# Patient Record
Sex: Female | Born: 1965 | Race: White | Hispanic: No | Marital: Married | Smoking: Former smoker
Health system: Southern US, Community
[De-identification: ages and names within clinical notes are randomized; demographics above are authoritative.]

## PROBLEM LIST (undated history)

## (undated) DIAGNOSIS — R87619 Unspecified abnormal cytological findings in specimens from cervix uteri: Secondary | ICD-10-CM

## (undated) DIAGNOSIS — F419 Anxiety disorder, unspecified: Secondary | ICD-10-CM

## (undated) DIAGNOSIS — I10 Essential (primary) hypertension: Secondary | ICD-10-CM

## (undated) DIAGNOSIS — N200 Calculus of kidney: Secondary | ICD-10-CM

## (undated) HISTORY — PX: US LT BREAST BX W LOC DEV 1ST LESION IMG BX SPEC US GUIDE: IMG5431

## (undated) HISTORY — PX: AUGMENTATION MAMMAPLASTY: SUR837

## (undated) HISTORY — PX: BREAST SURGERY: SHX581

## (undated) HISTORY — PX: ANKLE SURGERY: SHX546

## (undated) HISTORY — DX: Anxiety disorder, unspecified: F41.9

## (undated) HISTORY — DX: Calculus of kidney: N20.0

## (undated) HISTORY — PX: COLPOSCOPY: SHX161

## (undated) HISTORY — PX: COSMETIC SURGERY: SHX468

## (undated) HISTORY — DX: Unspecified abnormal cytological findings in specimens from cervix uteri: R87.619

---

## 2004-10-03 HISTORY — PX: TUBAL LIGATION: SHX77

## 2012-04-26 LAB — HM PAP SMEAR: HM Pap smear: NORMAL

## 2012-12-25 ENCOUNTER — Ambulatory Visit: Payer: Self-pay | Admitting: Adult Health

## 2012-12-25 ENCOUNTER — Encounter: Payer: Self-pay | Admitting: Adult Health

## 2012-12-25 ENCOUNTER — Ambulatory Visit (INDEPENDENT_AMBULATORY_CARE_PROVIDER_SITE_OTHER): Payer: 59 | Admitting: Adult Health

## 2012-12-25 VITALS — BP 124/87 | HR 86 | Temp 98.0°F | Resp 16 | Ht 66.0 in | Wt 156.0 lb

## 2012-12-25 DIAGNOSIS — Z7189 Other specified counseling: Secondary | ICD-10-CM

## 2012-12-25 DIAGNOSIS — Z1239 Encounter for other screening for malignant neoplasm of breast: Secondary | ICD-10-CM

## 2012-12-25 DIAGNOSIS — M545 Low back pain: Secondary | ICD-10-CM

## 2012-12-25 DIAGNOSIS — Z7689 Persons encountering health services in other specified circumstances: Secondary | ICD-10-CM

## 2012-12-25 MED ORDER — CYCLOBENZAPRINE HCL 5 MG PO TABS: 5.0000 mg | ORAL_TABLET | Freq: Three times a day (TID) | ORAL | Status: DC | PRN

## 2012-12-25 NOTE — Assessment & Plan Note (Signed)
Suspect sciatica. She is currently seeing a Chiropractor with good results. Take ibuprofen 400 mg - 600 mg q6h prn. She can also take tylenol. Heat/cold 3-4 times daily. Pillow between knees if side sleeper; pillow under knees if lying on back. Stretching exercises. Avoid any heavy lifting. Start flexeril tid prn for muscle spasms. RTC if symptoms worsen or fail to improve within 3-4 weeks.

## 2012-12-25 NOTE — Patient Instructions (Addendum)
  Thank you for choosing [place] at [facility] for your health care needs.  We will request your medical records from your previous PCP.  We will contact you for your mammogram appointment.  For your back:  Continue to apply heat and or cold for relief.  You can take ibuprofen 400 mg - 600 mg every 6 hours as needed. Make sure that you have food with this. You can also continue with the tylenol.  Continue to take the Pepcid to protect your stomach  I have ordered Flexeril for muscle spasms. This will make you sleepy.  Remember to activate your MyChart

## 2012-12-25 NOTE — Progress Notes (Signed)
  Subjective:    Patient ID: Catherine Yoder, female    DOB: 1966/04/15, 47 y.o.   MRN: [id number]  HPI  Patient is a pleasant 47 y/o female who presents to clinic to establish care. She recently moved to the area in December from [place]. She is feeling well overall. She has the following concern:  1) Back pain - she thinks it started when she moved. She is currently seeing a chiropractor and feels this is improving. She takes 600 mg of ibuprofen daily for approximately 1 month. She feels that heat helps. Her pain is in the low back and radiates down the right buttocks to her thigh. She is also doing stretching exercises. Because of her pain, she has not been able to exercise.   Health Maintenance:  Tdap - 2011  Flu Vaccine - 10/2012  PAP - (every 3 years if normal) - 05/2012 (Normal). Has had abnormal 2010. Will need PAP 05/2013  Mammogram - Needs  Eye Exam - Needs. She will schedule  Dental Exam - Every 6 month  Colonoscopy - Age 68.  Labs: Recently had physical 05/2012  Exercise - Not in the past 4 month 2/2 moving and back pain  Diet - Follows lean meats, fruits/vegetables. Healthy fats.    Review of Systems  Constitutional: Negative for fever, chills, appetite change and fatigue.  HENT: Negative.   Eyes: Negative.   Respiratory: Negative.   Cardiovascular: Positive for palpitations. Negative for chest pain and leg swelling.       Occassional palpitations  Gastrointestinal: Positive for constipation. Negative for nausea, vomiting, abdominal pain and diarrhea.       Hemorrhoids  Endocrine: Negative.   Genitourinary: Positive for pelvic pain and dyspareunia. Negative for dysuria, frequency, hematuria, flank pain, vaginal discharge, difficulty urinating, genital sores and vaginal pain.  Musculoskeletal: Positive for back pain.    BP 124/87  Pulse 86  Temp(Src) 98 F (36.7 C) (Oral)  Ht 5\' 6"  (1.676 m)  Wt 156 lb (70.761 kg)  BMI 25.19 kg/m2  SpO2 100%  LMP  12/01/2012    Objective:   Physical Exam  Constitutional: She is oriented to person, place, and time. She appears well-developed and well-nourished. No distress.  HENT:  Head: Normocephalic and atraumatic.  Mouth/Throat: Oropharynx is clear and moist.  Moderate cerumen buildup bil ears.  Eyes: Conjunctivae and EOM are normal. Pupils are equal, round, and reactive to light.  Neck: Normal range of motion. Neck supple. No tracheal deviation present. No thyromegaly present.  Cardiovascular: Normal rate, regular rhythm, normal heart sounds and intact distal pulses.  Exam reveals no gallop.   No murmur heard. Pulmonary/Chest: Effort normal and breath sounds normal. No respiratory distress. She has no wheezes. She has no rales.  Abdominal: Soft. Bowel sounds are normal. She exhibits no distension. There is no tenderness. There is no rebound.  Musculoskeletal: Normal range of motion. She exhibits no edema and no tenderness.  Lymphadenopathy:    She has no cervical adenopathy.  Neurological: She is alert and oriented to person, place, and time.  Skin: Skin is warm and dry.  Psychiatric: She has a normal mood and affect. Her behavior is normal. Judgment and thought content normal.       Assessment & Plan:

## 2012-12-25 NOTE — Assessment & Plan Note (Signed)
Recently moved to the area from [place]. Wants to establish care with PCP. Last physical exam 05/2012 including PAP and labs. Will request medical records from previous PCP.

## 2013-06-02 ENCOUNTER — Emergency Department: Payer: Self-pay | Admitting: Emergency Medicine

## 2013-06-02 LAB — COMPREHENSIVE METABOLIC PANEL
Albumin: 3.8 g/dL (ref 3.4–5.0)
Bilirubin,Total: 1.3 mg/dL — ABNORMAL HIGH (ref 0.2–1.0)
Calcium, Total: 9 mg/dL (ref 8.5–10.1)
Co2: 28 mmol/L (ref 21–32)
Creatinine: 0.74 mg/dL (ref 0.60–1.30)
EGFR (African American): >60
Glucose: 72 mg/dL (ref 65–99)
Osmolality: 277 (ref 275–301)
SGOT(AST): 23 U/L (ref 15–37)
SGPT (ALT): 29 U/L (ref 12–78)
Sodium: 140 mmol/L (ref 136–145)

## 2013-06-02 LAB — URINALYSIS, COMPLETE
Bilirubin,UR: NEGATIVE
Blood: NEGATIVE
Ketone: NEGATIVE
Nitrite: NEGATIVE
Ph: 7 (ref 4.5–8.0)
Squamous Epithelial: 2
WBC UR: 1 /HPF (ref 0–5)

## 2013-06-02 LAB — CBC
HCT: 44.6 % (ref 35.0–47.0)
MCH: 31.3 pg (ref 26.0–34.0)
MCHC: 34.3 g/dL (ref 32.0–36.0)
MCV: 91 fL (ref 80–100)
Platelet: 166 10*3/uL (ref 150–440)
WBC: 6.6 10*3/uL (ref 3.6–11.0)

## 2013-06-06 ENCOUNTER — Ambulatory Visit (INDEPENDENT_AMBULATORY_CARE_PROVIDER_SITE_OTHER): Payer: 59 | Admitting: Adult Health

## 2013-06-06 ENCOUNTER — Encounter: Payer: Self-pay | Admitting: Adult Health

## 2013-06-06 VITALS — BP 134/88 | HR 68 | Temp 98.2°F | Resp 14 | Wt 153.0 lb

## 2013-06-06 DIAGNOSIS — R030 Elevated blood-pressure reading, without diagnosis of hypertension: Secondary | ICD-10-CM

## 2013-06-06 DIAGNOSIS — R232 Flushing: Secondary | ICD-10-CM | POA: Insufficient documentation

## 2013-06-06 DIAGNOSIS — G43909 Migraine, unspecified, not intractable, without status migrainosus: Secondary | ICD-10-CM

## 2013-06-06 DIAGNOSIS — R6889 Other general symptoms and signs: Secondary | ICD-10-CM

## 2013-06-06 DIAGNOSIS — Z712 Person consulting for explanation of examination or test findings: Secondary | ICD-10-CM

## 2013-06-06 DIAGNOSIS — Z7189 Other specified counseling: Secondary | ICD-10-CM

## 2013-06-06 DIAGNOSIS — R899 Unspecified abnormal finding in specimens from other organs, systems and tissues: Secondary | ICD-10-CM

## 2013-06-06 DIAGNOSIS — N951 Menopausal and female climacteric states: Secondary | ICD-10-CM

## 2013-06-06 LAB — HEPATIC FUNCTION PANEL
ALT: 19 U/L (ref 0–35)
Total Bilirubin: 1.2 mg/dL (ref 0.3–1.2)
Total Protein: 6.3 g/dL (ref 6.0–8.3)

## 2013-06-06 MED ORDER — RIZATRIPTAN BENZOATE 5 MG PO TBDP: 5.0000 mg | ORAL_TABLET | ORAL | Status: DC | PRN

## 2013-06-06 NOTE — Assessment & Plan Note (Signed)
Experiencing hot flashes around menstrual cycle. She would like to try something "natural". Recommend Estroven or product containing black cohosh. Pt agreed.

## 2013-06-06 NOTE — Assessment & Plan Note (Signed)
Has taken Maxalt with good results. No need from preventative tx at this time as these are not occurring frequently. Sent in prescription for Maxalt.

## 2013-06-06 NOTE — Progress Notes (Signed)
  Subjective:    Patient ID: Catherine Yoder, female    DOB: August 11, 1966, 47 y.o.   MRN: [id number]  HPI  Patient is a pleasant 47 y/o female who presents to clinic s/p ED visit for headache. She had labs and a head CT but did not stay for treatment. She presents to clinic to review her CT. She also reports that she went home and took one of her daughter's maxalt. She had excellent results from the medication and would like a prescription for this.   Current Outpatient Prescriptions on File Prior to Visit  Medication Sig Dispense Refill  . acetaminophen (TYLENOL) 500 MG tablet Take 500 mg by mouth 2 (two) times daily.      Marland Kitchen ibuprofen (ADVIL,MOTRIN) 600 MG tablet Take 600 mg by mouth daily.      . cyclobenzaprine (FLEXERIL) 5 MG tablet Take 1 tablet (5 mg total) by mouth 3 (three) times daily as needed for muscle spasms.  30 tablet  1   No current facility-administered medications on file prior to visit.    Review of Systems  Respiratory: Negative.   Cardiovascular: Negative.   Neurological: Negative.      BP 134/88  Pulse 68  Temp(Src) 98.2 F (36.8 C) (Oral)  Resp 14  Wt 153 lb (69.4 kg)  BMI 24.71 kg/m2  SpO2 98%  LMP 05/19/2013    Objective:   Physical Exam  Constitutional: She is oriented to person, place, and time. She appears well-developed and well-nourished.  Cardiovascular: Normal rate, regular rhythm and normal heart sounds.  Exam reveals no gallop and no friction rub.   No murmur heard. Pulmonary/Chest: Effort normal and breath sounds normal. No respiratory distress. She has no wheezes. She has no rales.  Neurological: She is alert and oriented to person, place, and time.  Psychiatric: She has a normal mood and affect. Her behavior is normal. Judgment and thought content normal.          Assessment & Plan:

## 2013-06-06 NOTE — Patient Instructions (Addendum)
  For your hot flashes you can try Estroven or anything with black cohosh   I have prescribed Maxalt for your migraine headache. You may repeat a dose after 2 hours if necessary. Do not take more than 2 doses in a 24 hour period.  Please have your blood work done prior to leaving the office.

## 2013-06-06 NOTE — Assessment & Plan Note (Signed)
May be transient or related to white coat syndrome. Patient will monitor b/p at work. She does not want to take daily medication. Suggested diet and exercise (life style change) to improve blood pressure readings. If readings remain elevated then will recommend starting with diuretic.

## 2013-06-06 NOTE — Assessment & Plan Note (Signed)
Status post CT in the ED for headache and visual disturbance. Normal head CT. Discussed with patient. Also, patient had elevated total bilirubin. Recheck hepatic panel.

## 2013-06-07 ENCOUNTER — Encounter: Payer: Self-pay | Admitting: *Deleted

## 2014-03-19 ENCOUNTER — Encounter: Payer: Self-pay | Admitting: *Deleted

## 2014-08-06 ENCOUNTER — Ambulatory Visit: Payer: Self-pay | Admitting: Internal Medicine

## 2015-11-13 ENCOUNTER — Other Ambulatory Visit: Payer: Self-pay | Admitting: Student

## 2015-11-13 DIAGNOSIS — R1012 Left upper quadrant pain: Secondary | ICD-10-CM

## 2015-11-19 ENCOUNTER — Ambulatory Visit
Admission: RE | Admit: 2015-11-19 | Discharge: 2015-11-19 | Disposition: A | Discharge status: Home / Self Care | Source: Ambulatory Visit | Attending: Student | Admitting: Student

## 2015-11-19 DIAGNOSIS — N281 Cyst of kidney, acquired: Secondary | ICD-10-CM | POA: Diagnosis not present

## 2015-11-19 DIAGNOSIS — R1012 Left upper quadrant pain: Secondary | ICD-10-CM | POA: Insufficient documentation

## 2015-11-19 MED ORDER — IOHEXOL 350 MG/ML SOLN
100.0000 mL | Freq: Once | INTRAVENOUS | Status: AC | PRN
  Administered 2015-11-19: 100 mL via INTRAVENOUS

## 2015-12-25 ENCOUNTER — Other Ambulatory Visit: Payer: Self-pay | Admitting: Internal Medicine

## 2015-12-25 DIAGNOSIS — N858 Other specified noninflammatory disorders of uterus: Secondary | ICD-10-CM

## 2015-12-25 DIAGNOSIS — N941 Unspecified dyspareunia: Secondary | ICD-10-CM

## 2015-12-31 ENCOUNTER — Ambulatory Visit
Admission: RE | Admit: 2015-12-31 | Discharge: 2015-12-31 | Disposition: A | Discharge status: Home / Self Care | Source: Ambulatory Visit | Attending: Internal Medicine | Admitting: Internal Medicine

## 2015-12-31 DIAGNOSIS — N941 Unspecified dyspareunia: Secondary | ICD-10-CM | POA: Insufficient documentation

## 2015-12-31 DIAGNOSIS — N859 Noninflammatory disorder of uterus, unspecified: Secondary | ICD-10-CM | POA: Diagnosis present

## 2015-12-31 DIAGNOSIS — N858 Other specified noninflammatory disorders of uterus: Secondary | ICD-10-CM

## 2017-10-19 ENCOUNTER — Other Ambulatory Visit: Payer: Self-pay | Admitting: Internal Medicine

## 2017-10-19 DIAGNOSIS — Z1239 Encounter for other screening for malignant neoplasm of breast: Secondary | ICD-10-CM

## 2017-11-10 ENCOUNTER — Ambulatory Visit
Admission: RE | Admit: 2017-11-10 | Discharge: 2017-11-10 | Disposition: A | Discharge status: Home / Self Care | Source: Ambulatory Visit | Attending: Internal Medicine | Admitting: Internal Medicine

## 2017-11-10 DIAGNOSIS — Z1231 Encounter for screening mammogram for malignant neoplasm of breast: Secondary | ICD-10-CM | POA: Insufficient documentation

## 2017-11-10 DIAGNOSIS — Z1239 Encounter for other screening for malignant neoplasm of breast: Secondary | ICD-10-CM

## 2017-11-14 ENCOUNTER — Other Ambulatory Visit: Payer: Self-pay | Admitting: Internal Medicine

## 2017-11-14 DIAGNOSIS — R928 Other abnormal and inconclusive findings on diagnostic imaging of breast: Secondary | ICD-10-CM

## 2017-11-14 DIAGNOSIS — N6310 Unspecified lump in the right breast, unspecified quadrant: Secondary | ICD-10-CM

## 2017-11-23 ENCOUNTER — Ambulatory Visit
Admission: RE | Admit: 2017-11-23 | Discharge: 2017-11-23 | Disposition: A | Discharge status: Home / Self Care | Source: Ambulatory Visit | Attending: Internal Medicine | Admitting: Internal Medicine

## 2017-11-23 DIAGNOSIS — R928 Other abnormal and inconclusive findings on diagnostic imaging of breast: Secondary | ICD-10-CM | POA: Insufficient documentation

## 2017-11-23 DIAGNOSIS — N6310 Unspecified lump in the right breast, unspecified quadrant: Secondary | ICD-10-CM

## 2018-06-13 ENCOUNTER — Ambulatory Visit
Admission: RE | Admit: 2018-06-13 | Discharge: 2018-06-13 | Disposition: A | Discharge status: Home / Self Care | Source: Ambulatory Visit | Attending: Internal Medicine | Admitting: Internal Medicine

## 2018-06-13 ENCOUNTER — Other Ambulatory Visit: Payer: Self-pay | Admitting: Internal Medicine

## 2018-06-13 DIAGNOSIS — R1031 Right lower quadrant pain: Secondary | ICD-10-CM

## 2018-06-13 DIAGNOSIS — N281 Cyst of kidney, acquired: Secondary | ICD-10-CM | POA: Diagnosis not present

## 2018-06-13 HISTORY — DX: Essential (primary) hypertension: I10

## 2018-06-13 MED ORDER — IOPAMIDOL (ISOVUE-300) INJECTION 61%
100.0000 mL | Freq: Once | INTRAVENOUS | Status: AC | PRN
  Administered 2018-06-13: 100 mL via INTRAVENOUS

## 2021-01-27 ENCOUNTER — Other Ambulatory Visit: Payer: Self-pay | Admitting: Internal Medicine

## 2021-01-27 DIAGNOSIS — Z1231 Encounter for screening mammogram for malignant neoplasm of breast: Secondary | ICD-10-CM

## 2021-02-03 ENCOUNTER — Ambulatory Visit
Admission: RE | Admit: 2021-02-03 | Discharge: 2021-02-03 | Disposition: A | Discharge status: Home / Self Care | Source: Ambulatory Visit | Attending: Internal Medicine | Admitting: Internal Medicine

## 2021-02-03 ENCOUNTER — Other Ambulatory Visit: Payer: Self-pay

## 2021-02-03 DIAGNOSIS — Z1231 Encounter for screening mammogram for malignant neoplasm of breast: Secondary | ICD-10-CM | POA: Insufficient documentation

## 2022-07-06 ENCOUNTER — Encounter: Payer: Self-pay | Admitting: Obstetrics and Gynecology

## 2022-07-06 ENCOUNTER — Ambulatory Visit: Admitting: Obstetrics and Gynecology

## 2022-07-06 ENCOUNTER — Other Ambulatory Visit (HOSPITAL_COMMUNITY)
Admission: RE | Admit: 2022-07-06 | Discharge: 2022-07-06 | Disposition: A | Discharge status: Home / Self Care | Source: Ambulatory Visit | Attending: Obstetrics and Gynecology | Admitting: Obstetrics and Gynecology

## 2022-07-06 VITALS — BP 120/80 | HR 111 | Temp 98.3°F | Ht 63.75 in | Wt 155.0 lb

## 2022-07-06 DIAGNOSIS — Z8041 Family history of malignant neoplasm of ovary: Secondary | ICD-10-CM

## 2022-07-06 DIAGNOSIS — N952 Postmenopausal atrophic vaginitis: Secondary | ICD-10-CM

## 2022-07-06 DIAGNOSIS — Z803 Family history of malignant neoplasm of breast: Secondary | ICD-10-CM

## 2022-07-06 DIAGNOSIS — Z113 Encounter for screening for infections with a predominantly sexual mode of transmission: Secondary | ICD-10-CM | POA: Insufficient documentation

## 2022-07-06 DIAGNOSIS — R102 Pelvic and perineal pain: Secondary | ICD-10-CM

## 2022-07-06 DIAGNOSIS — Z114 Encounter for screening for human immunodeficiency virus [HIV]: Secondary | ICD-10-CM

## 2022-07-06 DIAGNOSIS — Z1159 Encounter for screening for other viral diseases: Secondary | ICD-10-CM | POA: Diagnosis not present

## 2022-07-06 NOTE — Progress Notes (Signed)
GYNECOLOGY  VISIT   HPI: 56 y.o.   Married  Caucasian  female   G1P1001 with Patient's last menstrual period was 01/28/2020.   here for pelvic pain & pain with urination.  Hx renal stones.  Had lithotripsy.  No hematuria.   Some concern about STDs.   Very little discharge.   No vaginal bleeding.   Pain with sex started 6 months ago.   Random pain in the right and left lower quadrant.   No hormonal treatment.   Works in [employer] in [employer], [employer]. Daughter and grand-daughter live in [place].   GYNECOLOGIC HISTORY: Patient's last menstrual period was 01/28/2020. Contraception:  post menopausal & btl Menopausal hormone therapy:  none Last mammogram:  02-03-21 category c density birads 1:neg.  Last pap smear:   2019 neg per patient Abnormal pap 2013.  Normal colpo.  No tx.        OB History     Gravida  1   Para  1   Term  1   Preterm      AB      Living  1      SAB      IAB      Ectopic      Multiple      Live Births                 Patient Active Problem List   Diagnosis Date Noted   Encounter to discuss test results 06/06/2013   Migraine 06/06/2013   Hot flashes 06/06/2013   Elevated blood pressure reading 06/06/2013   Low back pain radiating to right leg 12/25/2012   Encounter to establish care 12/25/2012    Past Medical History:  Diagnosis Date   Abnormal Pap smear of cervix    Anxiety    Hypertension    Kidney stones     Past Surgical History:  Procedure Laterality Date   ANKLE SURGERY Right    BREAST SURGERY     CESAREAN SECTION  1994   COLPOSCOPY     COSMETIC SURGERY     tummy tuck   TUBAL LIGATION  2006    Current Outpatient Medications  Medication Sig Dispense Refill   acetaminophen (TYLENOL) 500 MG tablet Take 500 mg by mouth 2 (two) times daily.     buPROPion (WELLBUTRIN XL) 150 MG 24 hr tablet Take 150 mg by mouth every morning.     Cyanocobalamin (B-12 PO) Take by mouth.     ibuprofen (ADVIL,MOTRIN) 600 MG tablet Take  600 mg by mouth daily.     lisinopril (ZESTRIL) 10 MG tablet Take 10 mg by mouth daily.     VITAMIN D PO Take by mouth.     No current facility-administered medications for this visit.     ALLERGIES: Patient has no known allergies.  Family History  Problem Relation Age of Onset   Hypertension Mother    Breast cancer Mother 64   Skin cancer Father    Ovarian cancer Maternal Aunt    Heart attack Paternal Grandfather     Social History   Socioeconomic History   Marital status: Married    Spouse name: Not on file   Number of children: 1   Years of education: 14   Highest education level: Not on file  Occupational History   Occupation: RN    Comment: [employer]  Tobacco Use   Smoking status: Former    Types: Cigarettes    Quit date: 12/26/2006  Years since quitting: 15.5   Smokeless tobacco: Never  Substance and Sexual Activity   Alcohol use: Yes    Alcohol/week: 7.0 standard drinks of alcohol    Types: 7 Glasses of wine per week   Drug use: No   Sexual activity: Yes    Partners: Male    Birth control/protection: Post-menopausal    Comment: BTL  Other Topics Concern   Not on file  Social History Narrative   Not on file   Social Determinants of Health   Financial Resource Strain: Not on file  Food Insecurity: Not on file  Transportation Needs: Not on file  Physical Activity: Not on file  Stress: Not on file  Social Connections: Not on file  Intimate Partner Violence: Not on file    Review of Systems  Constitutional: Negative.   HENT: Negative.    Eyes: Negative.   Respiratory: Negative.    Cardiovascular: Negative.   Gastrointestinal: Negative.   Endocrine: Negative.   Genitourinary:  Positive for dysuria and pelvic pain.  Musculoskeletal: Negative.   Skin: Negative.   Allergic/Immunologic: Negative.   Neurological: Negative.   Hematological: Negative.   Psychiatric/Behavioral: Negative.      PHYSICAL EXAMINATION:    BP 120/80   Pulse (!) 111    Temp 98.3 F (36.8 C) (Oral)   Ht 5' 3.75" (1.619 m)   Wt 155 lb (70.3 kg)   LMP 01/28/2020 Comment: BTL  SpO2 97%   BMI 26.81 kg/m     General appearance: alert, cooperative and appears stated age   Abdomen: soft, non-tender, no masses,  no organomegaly   No abnormal inguinal nodes palpated  Pelvic: External genitalia:  no lesions              Urethra:  normal appearing urethra with no masses, tenderness or lesions              Bartholins and Skenes: normal                 Vagina: normal appearing vagina with normal color and discharge, no lesions              Cervix: no lesions.  No CMT.                Bimanual Exam:  Uterus:  normal size, contour, position, consistency, mobility, non-tender.  Tender over bladder.               Adnexa: no mass, fullness, tenderness              Chaperone was present for exam:  Joy, CMA  ASSESSMENT  Pelvic pain.  STD screening.  Vaginal atrophy.  Abnormal pap 2013.  Normal colpo.  No tx.  FH breast cancer and ovarian cancer.   PLAN  STD screening. Vaginitis testing.  Urinalysis:  sh 1.020, pH 6.0, 0 - 5 WBC, NS RBC, 6 - 10 epis, few bacteria, moderate mucus. UC sent.  We discussed treatment of vaginal atrophy with water based lubricants, cooking oils, and vaginal estrogen.  Patient declines pelvic US at this time.  She will schedule a screening mammogram.  Referral for genetic counseling.  We discussed the benefits of genetic counseling and testing.  Potential breast MRI and medical oncology consultation reviewed.  Return for annual exam   An After Visit Summary was printed and given to the patient.

## 2022-07-07 ENCOUNTER — Other Ambulatory Visit: Payer: Self-pay | Admitting: Internal Medicine

## 2022-07-07 DIAGNOSIS — Z1231 Encounter for screening mammogram for malignant neoplasm of breast: Secondary | ICD-10-CM

## 2022-07-07 LAB — HEPATITIS C ANTIBODY: Hepatitis C Ab: NONREACTIVE

## 2022-07-07 LAB — HIV ANTIBODY (ROUTINE TESTING W REFLEX): HIV 1&2 Ab, 4th Generation: NONREACTIVE

## 2022-07-07 LAB — RPR: RPR Ser Ql: NONREACTIVE

## 2022-07-08 ENCOUNTER — Encounter: Payer: Self-pay | Admitting: Obstetrics and Gynecology

## 2022-07-08 DIAGNOSIS — R102 Pelvic and perineal pain: Secondary | ICD-10-CM

## 2022-07-08 LAB — CERVICOVAGINAL ANCILLARY ONLY
Bacterial Vaginitis (gardnerella): NEGATIVE
Candida Glabrata: NEGATIVE
Candida Vaginitis: NEGATIVE
Chlamydia: NEGATIVE
Comment: NEGATIVE
Comment: NEGATIVE
Comment: NEGATIVE
Comment: NEGATIVE
Comment: NEGATIVE
Comment: NORMAL
Neisseria Gonorrhea: NEGATIVE
Trichomonas: NEGATIVE

## 2022-07-09 MED ORDER — NITROFURANTOIN MONOHYD MACRO 100 MG PO CAPS: 100.0000 mg | ORAL_CAPSULE | Freq: Two times a day (BID) | ORAL | 0 refills | Status: DC

## 2022-07-09 NOTE — Telephone Encounter (Signed)
Please schedule an office appointment for pelvic ultrasound and follow up with me.  Diagnosis is pelvic pain.  I signed an order for this.

## 2022-07-09 NOTE — Addendum Note (Signed)
Addended by: Yisroel Ramming, Dietrich Pates E on: 07/09/2022 05:06 PM   Modules accepted: Orders

## 2022-07-11 ENCOUNTER — Other Ambulatory Visit: Payer: Self-pay

## 2022-07-11 NOTE — Telephone Encounter (Signed)
U/S appt scheduled 07/12/22 at 2:30pm.

## 2022-07-12 ENCOUNTER — Ambulatory Visit (INDEPENDENT_AMBULATORY_CARE_PROVIDER_SITE_OTHER)

## 2022-07-12 ENCOUNTER — Ambulatory Visit (INDEPENDENT_AMBULATORY_CARE_PROVIDER_SITE_OTHER): Admitting: Obstetrics and Gynecology

## 2022-07-12 ENCOUNTER — Encounter: Payer: Self-pay | Admitting: Obstetrics and Gynecology

## 2022-07-12 VITALS — BP 128/84 | HR 76 | Resp 12 | Ht 63.0 in | Wt 157.0 lb

## 2022-07-12 DIAGNOSIS — R102 Pelvic and perineal pain: Secondary | ICD-10-CM

## 2022-07-12 NOTE — Progress Notes (Signed)
GYNECOLOGY  VISIT   HPI: 56 y.o.   Married  Caucasian  female   G1P1001 with Patient's last menstrual period was 01/28/2020.  here for   pelvic US due to pelvic pain.   She just started an Macrobid abx for Enterococcus UTI.  She is already feeling better.   Negative STD screening and vaginitis testing on 07/06/22.  FH of breast cancer in mother and ovarian cancer in an aunt.  GYNECOLOGIC HISTORY: Patient's last menstrual period was 01/28/2020. Contraception:  postmenopausal. Menopausal hormone therapy:  none Last mammogram:  02-03-21 category c density birads 1:neg.   has an appointment for November 3.   Last pap smear:   2019 neg per patient        OB History     Gravida  1   Para  1   Term  1   Preterm      AB      Living  1      SAB      IAB      Ectopic      Multiple      Live Births                 Patient Active Problem List   Diagnosis Date Noted   Encounter to discuss test results 06/06/2013   Migraine 06/06/2013   Hot flashes 06/06/2013   Elevated blood pressure reading 06/06/2013   Low back pain radiating to right leg 12/25/2012   Encounter to establish care 12/25/2012    Past Medical History:  Diagnosis Date   Abnormal Pap smear of cervix    Anxiety    Hypertension    Kidney stones     Past Surgical History:  Procedure Laterality Date   ANKLE SURGERY Right    BREAST SURGERY     CESAREAN SECTION  1994   COLPOSCOPY     COSMETIC SURGERY     tummy tuck   TUBAL LIGATION  2006    Current Outpatient Medications  Medication Sig Dispense Refill   acetaminophen (TYLENOL) 500 MG tablet Take 500 mg by mouth 2 (two) times daily.     buPROPion (WELLBUTRIN XL) 150 MG 24 hr tablet Take 150 mg by mouth every morning.     Cyanocobalamin (B-12 PO) Take by mouth.     ibuprofen (ADVIL,MOTRIN) 600 MG tablet Take 600 mg by mouth daily.     lisinopril (ZESTRIL) 10 MG tablet Take 10 mg by mouth daily.     nitrofurantoin, macrocrystal-monohydrate,  (MACROBID) 100 MG capsule Take 1 capsule (100 mg total) by mouth 2 (two) times daily. 10 capsule 0   VITAMIN D PO Take by mouth.     No current facility-administered medications for this visit.     ALLERGIES: Patient has no known allergies.  Family History  Problem Relation Age of Onset   Hypertension Mother    Breast cancer Mother 78   Skin cancer Father    Ovarian cancer Maternal Aunt    Heart attack Paternal Grandfather     Social History   Socioeconomic History   Marital status: Married    Spouse name: Not on file   Number of children: 1   Years of education: 14   Highest education level: Not on file  Occupational History   Occupation: RN    Comment: [employer]  Tobacco Use   Smoking status: Former    Types: Cigarettes    Quit date: 12/26/2006    Years since quitting:  15.5   Smokeless tobacco: Never  Substance and Sexual Activity   Alcohol use: Yes    Alcohol/week: 7.0 standard drinks of alcohol    Types: 7 Glasses of wine per week   Drug use: No   Sexual activity: Yes    Partners: Male    Birth control/protection: Post-menopausal    Comment: BTL  Other Topics Concern   Not on file  Social History Narrative   Not on file   Social Determinants of Health   Financial Resource Strain: Not on file  Food Insecurity: Not on file  Transportation Needs: Not on file  Physical Activity: Not on file  Stress: Not on file  Social Connections: Not on file  Intimate Partner Violence: Not on file    Review of Systems  See HPI.  PHYSICAL EXAMINATION:    BP 128/84   Pulse 76   Resp 12   Ht 5\' 3"  (1.6 m)   Wt 157 lb (71.2 kg)   LMP 01/28/2020 Comment: BTL  BMI 27.81 kg/m     General appearance: alert, cooperative and appears stated age  Pelvic US Uterus 6.47 x 3.67 x 3.40 cm.  No myometrial masses. EMS 3.27 mm.  Left ovary 3.19 x 1.30 x 1.26 cm.  Right ovary 2.56 x 1.30 x 1.37 cm.  No adnexal masses.  No free fluid.    ASSESSMENT  Pelvic pain.   Enterococcus UTI.  PLAN  Pelvic US images and report reviewed with patient.   Reassurance given.  Complete course of abx for UTI and call if symptoms do not completely resolve.  She will update her mammogram and I will then prescribe vaginal estrogen cream for treating atrophy.  We reviewed the benefit of using the cream to the urethra for UTI prevention.  She has an annual exam scheduled for 07/19/22.   An After Visit Summary was printed and given to the patient.  20 min  total time was spent for this patient encounter, including preparation, face-to-face counseling with the patient, coordination of care, and documentation of the encounter.

## 2022-07-12 NOTE — Progress Notes (Addendum)
56 y.o. G42P1001 Married Caucasian female here for annual exam.  She is till having the pelvic pain.  Pain is brought on by sex.  She has burning pain and then shooting pain that follows.  Coconut oil helps.   Treated recently for Enterococcus UTI.   Se had negative STD and vaginitis testing 07/06/22.  FH of ovarian and breast cancer.  Her pelvic US done 07/12/22 was normal.   She has a hx of renal stones and has had lithotripsy.  Her urinalysis on 07/06/22 did not show RBCs.   PCP:  Tracie Harrier MD   Patient's last menstrual period was 01/28/2020.           Sexually active: Yes.    The current method of family planning is post menopausal status.    Exercising: Yes.     Rowing machine yoga  Smoker:  no  Health Maintenance: Pap:  2019 neg per patient History of abnormal Pap:  Abnormal pap 2013.  Normal colpo.  No tx. MMG:  02-03-21 category c density birads 1:neg.   Has appointment for November.   Colonoscopy:  5 years ago patient believes she is due.  She is working on scheduling this. BMD:   n/a  Result n/a TDaP:  15 years ago.  She will do at [facility]  Gardasil:   no HIV:07/06/22 Hep C:07/06/22 Screening Labs:  PCP  will do in [facility] November.  Shingles/Covid/Flu:  completed.   reports that she quit smoking about 15 years ago. Her smoking use included cigarettes. She has never used smokeless tobacco. She reports current alcohol use of about 7.0 standard drinks of alcohol per week. She reports that she does not use drugs.  Past Medical History:  Diagnosis Date   Abnormal Pap smear of cervix    Anxiety    Hypertension    Kidney stones     Past Surgical History:  Procedure Laterality Date   ANKLE SURGERY Right    BREAST SURGERY     CESAREAN SECTION  1994   COLPOSCOPY     COSMETIC SURGERY     tummy tuck   TUBAL LIGATION  2006    Current Outpatient Medications  Medication Sig Dispense Refill   acetaminophen (TYLENOL) 500 MG tablet Take 500 mg by mouth 2 (two) times  daily.     buPROPion (WELLBUTRIN XL) 150 MG 24 hr tablet Take 150 mg by mouth every morning.     Cyanocobalamin (B-12 PO) Take by mouth.     ibuprofen (ADVIL,MOTRIN) 600 MG tablet Take 600 mg by mouth daily.     lisinopril (ZESTRIL) 10 MG tablet Take 10 mg by mouth daily.     VITAMIN D PO Take by mouth.     No current facility-administered medications for this visit.    Family History  Problem Relation Age of Onset   Hypertension Mother    Breast cancer Mother 22   Skin cancer Father    Ovarian cancer Maternal Aunt    Heart attack Paternal Grandfather     Review of Systems  Genitourinary:  Positive for pelvic pain.  All other systems reviewed and are negative.   Exam:   BP 104/66   Pulse 80   Ht 5' 4.17" (1.63 m)   Wt 154 lb 9.6 oz (70.1 kg)   LMP 01/28/2020 Comment: BTL  SpO2 98%   BMI 26.39 kg/m     General appearance: alert, cooperative and appears stated age Head: normocephalic, without obvious abnormality, atraumatic Neck: no  adenopathy, supple, symmetrical, trachea midline and thyroid normal to inspection and palpation Lungs: clear to auscultation bilaterally Breasts: normal appearance, consistent with bilateral augmentation, no masses or tenderness, No nipple retraction or dimpling, No nipple discharge or bleeding, No axillary adenopathy Heart: regular rate and rhythm Abdomen: soft, non-tender; no masses, no organomegaly Extremities: extremities normal, atraumatic, no cyanosis or edema Skin: skin color, texture, turgor normal. No rashes or lesions Lymph nodes: cervical, supraclavicular, and axillary nodes normal. Neurologic: grossly normal  Pelvic: External genitalia:  no lesions              No abnormal inguinal nodes palpated.              Urethra:  normal appearing urethra with no masses, tenderness or lesions              Bartholins and Skenes: normal                 Vagina: normal appearing vagina with normal color and discharge, no lesions               Cervix: no lesions              Pap taken: yes Bimanual Exam:  Uterus:  normal size, contour, position, consistency, mobility, non-tender              Adnexa: no mass, fullness, tenderness              Rectal exam: yes.  Confirms.              Anus:  normal sphincter tone, no lesions  Chaperone was present for exam:  Babs Sciara, CMA  Assessment:   Well woman visit with gynecologic exam. Pelvic pain.  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   Cervical cancer screening.  FH breast and ovarian cancer.   Genetics counseling scheduled for this month. Status post bilateral breast augmentation.  Plan: Mammogram screening discussed. Self breast awareness reviewed. Pap and HR HPV as above. Guidelines for Calcium, Vitamin D, regular exercise program including cardiovascular and weight bearing exercise. After mammogram is back and normal, will prescribe vaginal estrogen cream.  I discussed potential effect on breast cancer.  Follow up annually and prn.   After visit summary provided.

## 2022-07-19 ENCOUNTER — Other Ambulatory Visit (HOSPITAL_COMMUNITY)
Admission: RE | Admit: 2022-07-19 | Discharge: 2022-07-19 | Disposition: A | Discharge status: Home / Self Care | Source: Ambulatory Visit | Attending: Obstetrics and Gynecology | Admitting: Obstetrics and Gynecology

## 2022-07-19 ENCOUNTER — Ambulatory Visit (INDEPENDENT_AMBULATORY_CARE_PROVIDER_SITE_OTHER): Admitting: Obstetrics and Gynecology

## 2022-07-19 ENCOUNTER — Encounter: Payer: Self-pay | Admitting: Obstetrics and Gynecology

## 2022-07-19 VITALS — BP 104/66 | HR 80 | Ht 64.17 in | Wt 154.6 lb

## 2022-07-19 DIAGNOSIS — Z124 Encounter for screening for malignant neoplasm of cervix: Secondary | ICD-10-CM

## 2022-07-19 DIAGNOSIS — Z01419 Encounter for gynecological examination (general) (routine) without abnormal findings: Secondary | ICD-10-CM

## 2022-07-19 NOTE — Patient Instructions (Signed)

## 2022-07-20 LAB — CYTOLOGY - PAP
Comment: NEGATIVE
Diagnosis: NEGATIVE
High risk HPV: NEGATIVE

## 2022-07-27 ENCOUNTER — Inpatient Hospital Stay

## 2022-07-27 ENCOUNTER — Encounter: Payer: Self-pay | Admitting: Licensed Clinical Social Worker

## 2022-07-27 ENCOUNTER — Inpatient Hospital Stay: Attending: Oncology | Admitting: Licensed Clinical Social Worker

## 2022-07-27 DIAGNOSIS — Z803 Family history of malignant neoplasm of breast: Secondary | ICD-10-CM | POA: Diagnosis not present

## 2022-07-27 DIAGNOSIS — Z1502 Genetic susceptibility to malignant neoplasm of ovary: Secondary | ICD-10-CM | POA: Diagnosis not present

## 2022-07-27 DIAGNOSIS — Z8041 Family history of malignant neoplasm of ovary: Secondary | ICD-10-CM | POA: Diagnosis not present

## 2022-07-27 DIAGNOSIS — Z1501 Genetic susceptibility to malignant neoplasm of breast: Secondary | ICD-10-CM

## 2022-07-27 NOTE — Progress Notes (Signed)
REFERRING PROVIDER: Nunzio Cobbs, MD [address] [place],  [place] [address]  PRIMARY PROVIDER:  Tracie Harrier, MD  PRIMARY REASON FOR VISIT:  1. Family history of breast cancer   2. Family history of ovarian cancer      HISTORY OF PRESENT ILLNESS:   Catherine Yoder, a 56 y.o. female, was seen for a [place] cancer genetics consultation at the request of Dr. Yisroel Ramming due to a family history of cancer.  [name] presents to clinic today to discuss the possibility of a hereditary predisposition to cancer, genetic testing, and to further clarify her future cancer risks, as well as potential cancer risks for family members.   CANCER HISTORY:  [name] is a 56 y.o. female with no personal history of cancer.    RISK FACTORS:  Menarche was at age 73.  First live birth at age 3.  OCP use: rarely  Ovaries intact: yes.  Hysterectomy: no.  Menopausal status: postmenopausal.  HRT use: 2 months Colonoscopy: yes; normal. Mammogram within the last year: has one scheduled for November. Number of breast biopsies: 0. Up to date with pelvic exams: yes.  Past Medical History:  Diagnosis Date   Abnormal Pap smear of cervix    Anxiety    Hypertension    Kidney stones     Past Surgical History:  Procedure Laterality Date   ANKLE SURGERY Right    BREAST SURGERY     CESAREAN SECTION  1994   COLPOSCOPY     COSMETIC SURGERY     tummy tuck   TUBAL LIGATION  2006    FAMILY HISTORY:  We obtained a detailed, 4-generation family history.  Significant diagnoses are listed below: Family History  Problem Relation Age of Onset   Hypertension Mother    Breast cancer Mother        dx early 67s   Skin cancer Father    Tuberous sclerosis Brother    Ovarian cancer Maternal Aunt        dx 47s, d. 11s   Breast cancer Paternal Aunt        dx 71s   Heart attack Paternal Grandfather    Ms. Campillo has 1 daughter, 20. She has 1 sister, 5 brothers. One  brother passed at 99 due to complications from tuberous sclerosis. Both of Ms. Held's parents were tested and do not have a mutation in the tuberous sclerosis genes.   Ms. Esguerra mother had breast cancer in her early 56s and passed at 37 of Parkinson's. A maternal aunt had ovarian cancer in her 85s and passed from it. A maternal cousin had breast cancer in her 29s.   Ms. Stones father has had skin cancer removed. A paternal aunt had breast cancer in her 27s.   Ms. Gassner is unaware of previous family history of genetic testing for hereditary cancer risks. There is no reported Ashkenazi Jewish ancestry. There is no known consanguinity.    GENETIC COUNSELING ASSESSMENT: Ms. Beel is a 56 y.o. female with a family history of breast and ovarian cancer which is somewhat suggestive of a hereditary cancer syndrome and predisposition to cancer. We, therefore, discussed and recommended the following at today's visit.   DISCUSSION: We discussed that approximately 10% of breast cancer is hereditary. Most cases of hereditary breast/ovarian cancer are associated with BRCA1/2 genes, although there are other genes associated with hereditary  cancer as well. Cancers and risks are gene specific. We discussed that  testing is beneficial for several reasons including knowing about cancer risks, identifying potential screening and risk-reduction options that may be appropriate, and to understand if other family members could be at risk for cancer and allow them to undergo genetic testing.   We reviewed the characteristics, features and inheritance patterns of hereditary cancer syndromes. We also discussed genetic testing, including the appropriate family members to test, the process of testing, insurance coverage and turn-around-time for results. We discussed the implications of a negative, positive and/or variant of uncertain significant result. We recommended [name] pursue genetic testing for the [facility]  Common Hereditary Cancers+RNA gene panel.   Based on [name]'s family history of cancer, she meets medical criteria for genetic testing. Despite that she meets criteria, she may still have an out of pocket cost. We discussed that if her out of pocket cost for testing is over $100, the laboratory will call and confirm whether she wants to proceed with testing.  If the out of pocket cost of testing is less than $100 she will be billed by the genetic testing laboratory.   PLAN: After considering the risks, benefits, and limitations, [name] provided informed consent to pursue genetic testing and the blood sample was sent to [facility] for analysis of the Common Hereditary Cancers+RNA panel. Results should be available within approximately 2-3 weeks' time, at which point they will be disclosed by telephone to Ms. Fontenot, as will any additional recommendations warranted by these results. Ms. Grams will receive a summary of her genetic counseling visit and a copy of her results once available. This information will also be available in Epic.   [name] questions were answered to her satisfaction today. Our contact information was provided should additional questions or concerns arise. Thank you for the referral and allowing [place] to share in the care of your patient.   Faith Rogue, MS, [facility] Genetic Counselor [email or web address] Phone: [phone]  The patient was seen for a total of 25 minutes in face-to-face genetic counseling.  Dr. Grayland Ormond was available for discussion regarding this case.   _______________________________________________________________________ For Office Staff:  Number of people involved in session: 1 Was an Intern/ student involved with case: no

## 2022-07-28 LAB — URINALYSIS, COMPLETE W/RFL CULTURE
Bilirubin Urine: NEGATIVE
Casts: NONE SEEN /LPF
Crystals: NONE SEEN /HPF
Glucose, UA: NEGATIVE
Hgb urine dipstick: NEGATIVE
Hyaline Cast: NONE SEEN /LPF
Ketones, ur: NEGATIVE
Nitrites, Initial: NEGATIVE
Protein, ur: NEGATIVE
RBC / HPF: NONE SEEN /HPF (ref 0–2)
Specific Gravity, Urine: 1.02 (ref 1.001–1.035)
Yeast: NONE SEEN /HPF
pH: 6 (ref 5.0–8.0)

## 2022-07-28 LAB — CULTURE INDICATED

## 2022-07-28 LAB — URINE CULTURE
MICRO NUMBER:: 14006860
SPECIMEN QUALITY:: ADEQUATE

## 2022-08-05 ENCOUNTER — Other Ambulatory Visit: Payer: Self-pay | Admitting: Internal Medicine

## 2022-08-05 ENCOUNTER — Ambulatory Visit
Admission: RE | Admit: 2022-08-05 | Discharge: 2022-08-05 | Disposition: A | Discharge status: Home / Self Care | Source: Ambulatory Visit | Attending: Internal Medicine | Admitting: Internal Medicine

## 2022-08-05 DIAGNOSIS — Z1231 Encounter for screening mammogram for malignant neoplasm of breast: Secondary | ICD-10-CM

## 2022-08-06 ENCOUNTER — Emergency Department

## 2022-08-06 ENCOUNTER — Other Ambulatory Visit: Payer: Self-pay

## 2022-08-06 ENCOUNTER — Emergency Department
Admission: EM | Admit: 2022-08-06 | Discharge: 2022-08-06 | Disposition: A | Discharge status: Home / Self Care | Attending: Emergency Medicine | Admitting: Emergency Medicine

## 2022-08-06 ENCOUNTER — Encounter: Payer: Self-pay | Admitting: Emergency Medicine

## 2022-08-06 DIAGNOSIS — S93602A Unspecified sprain of left foot, initial encounter: Secondary | ICD-10-CM | POA: Insufficient documentation

## 2022-08-06 DIAGNOSIS — W010XXA Fall on same level from slipping, tripping and stumbling without subsequent striking against object, initial encounter: Secondary | ICD-10-CM | POA: Diagnosis not present

## 2022-08-06 DIAGNOSIS — S99922A Unspecified injury of left foot, initial encounter: Secondary | ICD-10-CM | POA: Diagnosis present

## 2022-08-06 NOTE — ED Provider Notes (Signed)
[facility] Emergency Department Provider Note     Event Date/Time   First MD Initiated Contact with Patient 08/06/22 1816     (approximate)   History   Foot Injury   HPI  Catherine Yoder is a 56 y.o. female presents to the ED for acute left foot pain after mechanical fall.  Patient apparently tripped over a water bottle while she was wearing heels today.  She presents with pain to the lateral aspect of the left foot.  She denies any other injury at this time.  She presents with her own crutches ambulating with NWB.     Physical Exam   Triage Vital Signs: ED Triage Vitals  Enc Vitals Group     BP 08/06/22 1804 (!) 171/96     Pulse Rate 08/06/22 1804 95     Resp 08/06/22 1804 16     Temp 08/06/22 1804 98.4 F (36.9 C)     Temp Source 08/06/22 1804 Oral     SpO2 08/06/22 1804 95 %     Weight --      Height --      Head Circumference --      Peak Flow --      Pain Score 08/06/22 1802 6     Pain Loc --      Pain Edu? --      Excl. in [place]? --     Most recent vital signs: Vitals:   08/06/22 1804  BP: (!) 171/96  Pulse: 95  Resp: 16  Temp: 98.4 F (36.9 C)  SpO2: 95%    General Awake, no distress. NAD CV:  Good peripheral perfusion.  RESP:  Normal effort.  ABD:  No distention.  MSK:  Left foot without obvious deformity or dislocation.  Patient with some subtle dorsal lateral ecchymosis and soft tissue swelling.  Patient with normal active range of motion of the ankle.  No calf or Achilles tenderness is elicited.   ED Results / Procedures / Treatments   Labs (all labs ordered are listed, but only abnormal results are displayed) Labs Reviewed - No data to display   EKG   RADIOLOGY  I personally viewed and evaluated these images as part of my medical decision making, as well as reviewing the written report by the radiologist.  ED Provider Interpretation: no acute fractures  DG Foot Complete Left  Result Date:  08/06/2022 CLINICAL DATA:  Left foot pain and swelling following a fall today. EXAM: LEFT FOOT - COMPLETE 3+ VIEW COMPARISON:  None Available. FINDINGS: Mild dorsal soft tissue swelling proximally. No fracture or dislocation seen. IMPRESSION: No fracture. Electronically Signed   By: Claudie Revering M.D.   On: 08/06/2022 18:26     PROCEDURES:  Critical Care performed: No  Procedures   MEDICATIONS ORDERED IN ED: Medications - No data to display   IMPRESSION / MDM / [place] / ED COURSE  I reviewed the triage vital signs and the nursing notes.                              Differential diagnosis includes, but is not limited to, foot sprain, foot fracture, ankle sprain, ankle fracture, ankle dislocation  Patient's presentation is most consistent with acute complicated illness / injury requiring diagnostic workup.  Patient to the ED for evaluation of acute left foot pain after mechanical injury.  Clinical picture is consistent with ankle  sprain after my review and interpretation of images does not reveal any acute fracture or dislocation.  Patient will be discharged home with a cam boot to ambulate. Patient is to follow up with podiatry as needed or otherwise directed. Patient is given ED precautions to return to the ED for any worsening or new symptoms.     FINAL CLINICAL IMPRESSION(S) / ED DIAGNOSES   Final diagnoses:  Sprain of left foot, initial encounter     Rx / DC Orders   ED Discharge Orders     None        Note:  This document was prepared using Dragon voice recognition software and may include unintentional dictation errors.    Lissa Hoard, PA-C 08/06/22 2339    Jene Every, MD 08/08/22 [phone]

## 2022-08-06 NOTE — ED Triage Notes (Signed)
Pt with left foot injury following falling over a water bottle while wearing heels today.  Left foot pain and swelling noted.  Pt presents on crutches.

## 2022-08-06 NOTE — Discharge Instructions (Addendum)
Your exam and x-ray shows no evidence of any acute fracture or dislocation.  Wear the cam boot as needed to ambulate with crutches.  Follow-up with podiatry for ongoing foot pain management.

## 2022-08-08 ENCOUNTER — Telehealth: Payer: Self-pay | Admitting: Licensed Clinical Social Worker

## 2022-08-09 ENCOUNTER — Other Ambulatory Visit: Payer: Self-pay | Admitting: Obstetrics and Gynecology

## 2022-08-09 MED ORDER — ESTRADIOL 0.1 MG/GM VA CREA: TOPICAL_CREAM | VAGINAL | 2 refills | Status: DC

## 2022-08-09 NOTE — Progress Notes (Signed)
Rx for vaginal estrogen cream.  ?

## 2022-08-10 ENCOUNTER — Ambulatory Visit: Payer: Self-pay | Admitting: Licensed Clinical Social Worker

## 2022-08-10 ENCOUNTER — Encounter: Payer: Self-pay | Admitting: Licensed Clinical Social Worker

## 2022-08-10 DIAGNOSIS — Z1379 Encounter for other screening for genetic and chromosomal anomalies: Secondary | ICD-10-CM | POA: Insufficient documentation

## 2022-08-10 NOTE — Progress Notes (Signed)
HPI:   Ms. Langseth was previously seen in the [place] clinic due to a family history of cancer and concerns regarding a hereditary predisposition to cancer. Please refer to our prior cancer genetics clinic note for more information regarding our discussion, assessment and recommendations, at the time. Ms. Messman recent genetic test results were disclosed to her, as were recommendations warranted by these results. These results and recommendations are discussed in more detail below.  CANCER HISTORY:  Oncology History   No history exists.    FAMILY HISTORY:  We obtained a detailed, 4-generation family history.  Significant diagnoses are listed below: Family History  Problem Relation Age of Onset   Hypertension Mother    Breast cancer Mother        dx early 33s   Skin cancer Father    Tuberous sclerosis Brother    Ovarian cancer Maternal Aunt        dx 3s, d. 74s   Breast cancer Paternal Aunt        dx 26s   Heart attack Paternal Grandfather      Ms. Maulding has 1 daughter, 3. She has 1 sister, 5 brothers. One brother passed at 32 due to complications from tuberous sclerosis. Both of Ms. Hanssen's parents were tested and do not have a mutation in the tuberous sclerosis genes.    Ms. Retana mother had breast cancer in her early 61s and passed at 30 of Parkinson's. A maternal aunt had ovarian cancer in her 1s and passed from it. A maternal cousin had breast cancer in her 59s.    Ms. Perlow father has had skin cancer removed. A paternal aunt had breast cancer in her 4s.    Ms. Perry is unaware of previous family history of genetic testing for hereditary cancer risks. There is no reported Ashkenazi Jewish ancestry. There is no known consanguinity.     GENETIC TEST RESULTS:  The [facility] Common Hereditary Cancers+RNA Panel found no pathogenic mutations.   The Common Hereditary Cancers Panel + RNA offered by [facility] includes sequencing and/or deletion  duplication testing of the following 47 genes: APC, ATM, AXIN2, BARD1, BMPR1A, BRCA1, BRCA2, BRIP1, CDH1, CDKN2A (p14ARF), CDKN2A (p16INK4a), CKD4, CHEK2, CTNNA1, DICER1, EPCAM (Deletion/duplication testing only), GREM1 (promoter region deletion/duplication testing only), KIT, MEN1, MLH1, MSH2, MSH3, MSH6, MUTYH, NBN, NF1, NHTL1, PALB2, PDGFRA, PMS2, POLD1, POLE, PTEN, RAD50, RAD51C, RAD51D, SDHB, SDHC, SDHD, SMAD4, SMARCA4. STK11, TP53, TSC1, TSC2, and VHL.  The following genes were evaluated for sequence changes only: SDHA and HOXB13 c.251G>A variant only.  The test report has been scanned into EPIC and is located under the Molecular Pathology section of the Results Review tab.  A portion of the result report is included below for reference. Genetic testing reported out on 08/07/2022.      Even though a pathogenic variant was not identified, possible explanations for the cancer in the family may include: There may be no hereditary risk for cancer in the family. The cancers in her family may be sporadic/familial or due to other genetic and environmental factors. There may be a gene mutation in one of these genes that current testing methods cannot detect but that chance is small. There could be another gene that has not yet been discovered, or that we have not yet tested, that is responsible for the cancer diagnoses in the family.  It is also possible there is a hereditary cause for the cancer in the family that Ms. Sherwood did not inherit.  Therefore, it is important to remain in touch with cancer genetics in the future so that we can continue to offer Ms. Selvage the most up to date genetic testing.   ADDITIONAL GENETIC TESTING:  We discussed with Ms. Jett that her genetic testing was fairly extensive.  If there are additional relevant genes identified to increase cancer risk that can be analyzed in the future, we would be happy to discuss and coordinate this testing at that time.     CANCER  SCREENING RECOMMENDATIONS:  Ms. Mankowski test result is considered negative (normal).  This means that we have not identified a hereditary cause for her family history of cancer at this time.   An individual's cancer risk and medical management are not determined by genetic test results alone. Overall cancer risk assessment incorporates additional factors, including personal medical history, family history, and any available genetic information that may result in a personalized plan for cancer prevention and surveillance. Therefore, it is recommended she continue to follow the cancer management and screening guidelines provided by her primary healthcare provider.  Based on Ms. Bensinger's personal and family history of cancer as well as her genetic test results, risk model Harriett Rush was used to estimate her risk of developing breast cancer. This estimates her lifetime risk of developing breast cancer to be approximately 18%.  The patient's lifetime breast cancer risk is a preliminary estimate based on available information using one of several models endorsed by the [employer]  (NCCN). The NCCN recommends consideration of breast MRI screening as an adjunct to mammography for patients at high risk (defined as 20% or greater lifetime risk).  This risk estimate can change over time and may be repeated to reflect new information in her personal or family history in the future.    RECOMMENDATIONS FOR FAMILY MEMBERS:   Since she did not inherit a identifiable mutation in a cancer predisposition gene included on this panel, her children could not have inherited a known mutation from her in one of these genes. Individuals in this family might be at some increased risk of developing cancer, over the general population risk, due to the family history of cancer.  Individuals in the family should notify their providers of the family history of cancer. We recommend women in this family have a  yearly mammogram beginning at age 55, or 3 years younger than the earliest onset of cancer, an annual clinical breast exam, and perform monthly breast self-exams.  Family members should have colonoscopies by at age 39, or earlier, as recommended by their providers. Other members of the family may still carry a pathogenic variant in one of these genes that Ms. Constantino did not inherit. Based on the family history, we recommend her maternal relatives have genetic counseling and testing. Ms. Marton will let us know if we can be of any assistance in coordinating genetic counseling and/or testing for this family member.    FOLLOW-UP:  Lastly, we discussed with Ms. Trentham that cancer genetics is a rapidly advancing field and it is possible that new genetic tests will be appropriate for her and/or her family members in the future. We encouraged her to remain in contact with cancer genetics on an annual basis so we can update her personal and family histories and let her know of advances in cancer genetics that may benefit this family.   Our contact number was provided. Ms. Nemitz questions were answered to her satisfaction, and she knows she is welcome to call  us at anytime with additional questions or concerns.    Faith Rogue, MS, [facility] Genetic Counselor [email or web address] Phone: [phone]

## 2022-08-10 NOTE — Telephone Encounter (Signed)
I contacted Ms. Liming to discuss her genetic testing results. No pathogenic variants were identified in the 47 genes analyzed. Detailed clinic note to follow.   The test report has been scanned into EPIC and is located under the Molecular Pathology section of the Results Review tab.  A portion of the result report is included below for reference.     Lacy Duverney, MS, [facility] Genetic Counselor [email or web address] Phone: [phone]

## 2022-09-01 ENCOUNTER — Encounter: Payer: Self-pay | Admitting: Urology

## 2022-09-01 ENCOUNTER — Ambulatory Visit: Admitting: Urology

## 2022-09-01 ENCOUNTER — Ambulatory Visit
Admission: RE | Admit: 2022-09-01 | Discharge: 2022-09-01 | Disposition: A | Discharge status: Home / Self Care | Source: Ambulatory Visit | Attending: Urology | Admitting: Urology

## 2022-09-01 ENCOUNTER — Other Ambulatory Visit: Payer: Self-pay | Admitting: Urology

## 2022-09-01 VITALS — BP 119/82 | HR 71 | Ht 64.0 in | Wt 150.0 lb

## 2022-09-01 DIAGNOSIS — N200 Calculus of kidney: Secondary | ICD-10-CM | POA: Insufficient documentation

## 2022-09-01 DIAGNOSIS — N201 Calculus of ureter: Secondary | ICD-10-CM | POA: Diagnosis not present

## 2022-09-01 MED ORDER — KETOROLAC TROMETHAMINE 10 MG PO TABS: 10.0000 mg | ORAL_TABLET | Freq: Four times a day (QID) | ORAL | 0 refills | Status: DC | PRN

## 2022-09-01 NOTE — Progress Notes (Signed)
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  CT at that time reportedly showed a 6 mm left UPJ stone with hydronephrosis and a smaller 4 mm left lower pole stone.  Urine culture showed no growth and she was discharged with Flomax.  Her pain has been well-controlled with ibuprofen alone, and her last dose of ibuprofen was the evening of 08/31/2022.  She denies any fevers or chills.  She does have a prescription for oxycodone but has not had to take that since the ibuprofen has been working.  She has a history of kidney stones, previously requiring lithotripsy about 15 years ago, and a spontaneously passed stone about 10 years ago.   PMH: Past Medical History:  Diagnosis Date   Abnormal Pap smear of cervix    Anxiety    Hypertension    Kidney stones     Surgical History: Past Surgical History:  Procedure Laterality Date   ANKLE SURGERY Right    AUGMENTATION MAMMAPLASTY Bilateral    sept 2022   BREAST SURGERY     CESAREAN SECTION  1994   COLPOSCOPY     COSMETIC SURGERY     tummy tuck   TUBAL LIGATION  2006     Family History: Family History  Problem Relation Age of Onset   Hypertension Mother    Breast cancer Mother        dx early 40s   Skin cancer Father    Tuberous sclerosis Brother    Ovarian cancer Maternal Aunt        dx 1s, d. 47s   Breast cancer Paternal Aunt        dx 36s   Heart attack Paternal Grandfather     Social History:  reports that she quit smoking about 15 years ago. Her smoking use included cigarettes. She has been exposed to tobacco smoke. She has never used smokeless tobacco. She reports current alcohol use of about 7.0 standard drinks of alcohol per week. She reports that she does not use drugs.  Physical Exam: BP 119/82   Pulse 71   Ht 5\' 4"  (1.626 m)   Wt 150 lb (68  kg)   LMP 01/28/2020 Comment: BTL  BMI 25.75 kg/m    Constitutional:  Alert and oriented, No acute distress. Cardiovascular: No clubbing, cyanosis, or edema. Respiratory: Normal respiratory effort, no increased work of breathing. GI: Abdomen is soft, nontender, nondistended, no abdominal masses  Laboratory Data: Reviewed in care everywhere, urine culture no growth, normal renal function  Pertinent Imaging: I have personally viewed and interpreted the KUB today that shows a obvious 6 mm stone over the left UPJ.  CT report from outside hospital reviewed from 08/26/2022 showing a 6 mm left UPJ stone, images unavailable to personally review.  Assessment & Plan:   56 year old female with a 6 mm left UPJ stone, no clinical or laboratory evidence of infection.  No migration of stone on KUB today, but clearly seen on KUB.  We discussed various treatment options for urolithiasis including observation with or without medical expulsive therapy, shockwave lithotripsy (SWL), ureteroscopy and laser lithotripsy with stent placement, and percutaneous nephrolithotomy.  We discussed that management is based on stone size, location, density, patient co-morbidities, and patient preference.   Stones <41mm in size have a >80% spontaneous passage rate. Data surrounding  the use of tamsulosin for medical expulsive therapy is controversial, but meta analyses suggests it is most efficacious for distal stones between 5-72mm in size. Possible side effects include dizziness/lightheadedness, and retrograde ejaculation.  SWL has a lower stone free rate in a single procedure, but also a lower complication rate compared to ureteroscopy and avoids a stent and associated stent related symptoms. Possible complications include renal hematoma, steinstrasse, and need for additional treatment.  Ureteroscopy with laser lithotripsy and stent placement has a higher stone free rate than SWL in a single procedure, however increased  complication rate including possible infection, ureteral injury, bleeding, and stent related morbidity. Common stent related symptoms include dysuria, urgency/frequency, and flank pain.  After an extensive discussion of the risks and benefits of the above treatment options, the patient would like to proceed with left shockwave lithotripsy next week.  Oral Toradol prescription sent in case she has a return of severe renal colic, counseled to avoid this medication and all NSAIDs 48 hours prior to procedure  Legrand Rams, MD 09/01/2022  [facility] [address] [place], [place] [address] [phone]

## 2022-09-01 NOTE — Patient Instructions (Signed)
ESWL for Kidney Stones  Extracorporeal shock wave lithotripsy (ESWL) is a treatment that can help break up kidney stones that are too large to pass on their own.  This is a nonsurgical procedure that breaks up a kidney stone with shock waves. These shock waves pass through your body and focus on the kidney stone. They cause the kidney stone to break into smaller pieces (fragments) while it is still in the urinary tract. The fragments of stone can pass more easily out of your body in the urine. Tell a health care provider about: Any allergies you have. All medicines you are taking, including vitamins, herbs, eye drops, creams, and over-the-counter medicines. Any problems you or family members have had with anesthetic medicines. Any bleeding problems you have. Any surgeries you have had. Any medical conditions you have. Whether you are pregnant or may be pregnant. What are the risks? Your health care provider will talk with you about risks. These may include: Infection. Bleeding from the kidney. Bruising of the kidney or skin. Scarring of the kidney. This can lead to: Increased blood pressure. Poor kidney function. Return (recurrence) of kidney stones. Damage to other structures or organs. This may include the liver, colon, spleen, or pancreas. Blockage (obstruction) of the tube that carries urine from the kidney to the bladder (ureter). Failure of the kidney stone to break into fragments. What happens before the procedure? When to stop eating and drinking Follow instructions from your health care provider about what you may eat and drink. These may include: 8 hours before your procedure Stop eating most foods. Do not eat meat, fried foods, or fatty foods. Eat only light foods, such as toast or crackers. All liquids are okay except energy drinks and alcohol. 6 hours before your procedure Stop eating. Drink only clear liquids, such as water, clear fruit juice, black coffee, plain tea,  and sports drinks. Do not drink energy drinks or alcohol. 2 hours before your procedure Stop drinking all liquids. You may be allowed to take medicines with small sips of water. If you do not follow your health care provider's instructions, your procedure may be delayed or canceled. Medicines Ask your health care provider about: Changing or stopping your regular medicines. These include any diabetes medicines or blood thinners you take. Taking medicines such as aspirin and ibuprofen. These medicines can thin your blood. Do not take them unless your health care provider tells you to. Taking over-the-counter medicines, vitamins, herbs, and supplements. Tests You may have tests, such as: Blood tests. Urine tests. Imaging tests. This may include a CT scan. Surgery safety Ask your health care provider: How your surgery site will be marked. What steps will be taken to help prevent infection. These steps may include: Washing skin with a soap that kills germs. Receiving antibiotics. General instructions If you will be going home right after the procedure, plan to have a responsible adult: Take you home from the hospital or clinic. You will not be allowed to drive. Care for you for the time you are told. What happens during the procedure?  An IV will be inserted into one of your veins. You may be given: A sedative. This helps you relax. Anesthesia. This will: Numb certain areas of your body. Make you fall asleep for surgery. A water-filled cushion may be placed behind your kidney or on your abdomen. In some cases, you may be placed in a tub of lukewarm water. Your body will be positioned in a way that makes it   easier to target the kidney stone. An X-ray or ultrasound exam will be done to locate your stone. Shock waves will be aimed at the stone. If you are awake, you may feel a tapping sensation as the shock waves pass through your body. A small mesh tube (stent) may be placed in your  ureter. This will help keep urine flowing from the kidney if the fragments of the stone have been blocking the ureter. The stent will be removed at a later time by your health care provider. The procedure may vary among health care providers and hospitals. What happens after the procedure? Your blood pressure, heart rate, breathing rate, and blood oxygen level will be monitored until you leave the hospital or clinic. You may have an X-ray after the procedure to see how many of the kidney stones were broken up. This will also show how much of the stone has passed. If there are still large fragments after treatment, you may need to have a second procedure at a later time. This information is not intended to replace advice given to you by your health care provider. Make sure you discuss any questions you have with your health care provider. Document Revised: 01/20/2022 Document Reviewed: 01/20/2022 Elsevier Patient Education  2023 Elsevier Inc.  ESWL for Kidney Stones, Care After The following information offers guidance on how to care for yourself after your procedure. Your health care provider may also give you more specific instructions. If you have problems or questions, contact your health care provider. What can I expect after the procedure? After the procedure, it is common to have: Some blood in your urine. This should only last for a few days. Soreness in your back, sides, or upper abdomen for a few days. Blotches or bruises on the area where the shock wave entered the skin. Pain, discomfort, or nausea when pieces (fragments) of the kidney stone move through the tube that carries urine from the kidney to the bladder (ureter). Fragments may pass soon after the procedure. They may also take up to 4-8 weeks to pass. If you have severe pain or nausea, contact your health care provider. This may be caused by a large stone that was not broken up enough. This may mean that you need more  treatment. Some pain or discomfort during urination. Some pain or discomfort in the lower abdomen or at the base of the penis. Follow these instructions at home: Medicines  Take over-the-counter and prescription medicines only as told by your health care provider. If you were prescribed antibiotics, take them as told by your health care provider. Do not stop using the antibiotic even if you start to feel better. Ask your health care provider if the medicine prescribed to you: Requires you to avoid driving or using machinery. Can cause constipation. You may need to take these actions to prevent or treat constipation: Take over-the-counter or prescription medicines. Eat foods that are high in fiber, such as beans, whole grains, and fresh fruits and vegetables. Limit foods that are high in fat and processed sugars, such as fried or sweet foods. Eating and drinking  Follow instructions from your health care provider about what you may eat and drink. You may be told to: Reduce how much salt (sodium) you eat or drink. Check ingredients and nutrition facts on packaged foods and drinks to see how much sodium they contain. Reduce how much meat you eat. Drink enough fluid to keep your urine pale yellow. This can help you pass   any pieces of the stone that are left. It can also prevent new stones from forming. Eat plenty of fresh fruits and vegetables. Eat the recommended amount of calcium for your age and gender. Ask your health care provider how much calcium you should have. Activity Get plenty of rest as told by your health care provider. Avoid sitting for a long time without moving. Get up to take short walks every 1-2 hours. This is important to improve blood flow and breathing. Ask for help if you feel weak or unsteady. Your health care provider may tell you to lie in a certain position (postural drainage) and tap firmly (percuss) over your kidney area to help stone fragments pass. Follow  instructions as told by your health care provider. Return to your normal activities as told by your health care provider. Ask your health care provider what activities are safe for you. Most people can resume normal activities 1-2 days after the procedure. General instructions If told, strain all urine through the strainer that was provided by your health care provider. Keep all fragments for your health care provider to see. Any stones that are found may be sent to a medical lab for examination. The stone may be as small as a grain of salt. Keep all follow-up visits. This is important if you had a stent placed because it may need to stay in place for a few weeks. Ask your health care provider when the stent will be removed. Contact a health care provider if: You have a fever or chills. You have severe nausea that leads to persistent vomiting. You have any of these urinary symptoms: Increased blood or blood clots in the urine. Urine that smells bad or unusual. A strong urge to urinate after emptying your bladder. Pain or burning with urination that does not go away. A continued need to urinate more often than usual. You have a stent, and it comes out. Get help right away if: You have severe pain in your back, sides, or upper abdomen. You faint. You have any of these urinary symptoms: Severe pain while urinating. More blood in your urine, or blood in your urine when you did not have any before. Blood clots in your urine larger than 1 inch (2.5 cm) in size. You pass only a small amount of urine when you urinate or are unable to pass any urine. This information is not intended to replace advice given to you by your health care provider. Make sure you discuss any questions you have with your health care provider. Document Revised: 01/20/2022 Document Reviewed: 01/20/2022 Elsevier Patient Education  2023 Elsevier Inc.  

## 2022-09-01 NOTE — H&P (View-Only) (Signed)
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  CT at that time reportedly showed a 6 mm left UPJ stone with hydronephrosis and a smaller 4 mm left lower pole stone.  Urine culture showed no growth and she was discharged with Flomax.  Her pain has been well-controlled with ibuprofen alone, and her last dose of ibuprofen was the evening of 08/31/2022.  She denies any fevers or chills.  She does have a prescription for oxycodone but has not had to take that since the ibuprofen has been working.  She has a history of kidney stones, previously requiring lithotripsy about 15 years ago, and a spontaneously passed stone about 10 years ago.   PMH: Past Medical History:  Diagnosis Date   Abnormal Pap smear of cervix    Anxiety    Hypertension    Kidney stones     Surgical History: Past Surgical History:  Procedure Laterality Date   ANKLE SURGERY Right    AUGMENTATION MAMMAPLASTY Bilateral    sept 2022   BREAST SURGERY     CESAREAN SECTION  1994   COLPOSCOPY     COSMETIC SURGERY     tummy tuck   TUBAL LIGATION  2006     Family History: Family History  Problem Relation Age of Onset   Hypertension Mother    Breast cancer Mother        dx early 30s   Skin cancer Father    Tuberous sclerosis Brother    Ovarian cancer Maternal Aunt        dx 28s, d. 62s   Breast cancer Paternal Aunt        dx 20s   Heart attack Paternal Grandfather     Social History:  reports that she quit smoking about 15 years ago. Her smoking use included cigarettes. She has been exposed to tobacco smoke. She has never used smokeless tobacco. She reports current alcohol use of about 7.0 standard drinks of alcohol per week. She reports that she does not use drugs.  Physical Exam: BP 119/82   Pulse 71   Ht 5\' 4"  (1.626 m)   Wt 150 lb (68  kg)   LMP 01/28/2020 Comment: BTL  BMI 25.75 kg/m    Constitutional:  Alert and oriented, No acute distress. Cardiovascular: No clubbing, cyanosis, or edema. Respiratory: Normal respiratory effort, no increased work of breathing. GI: Abdomen is soft, nontender, nondistended, no abdominal masses  Laboratory Data: Reviewed in care everywhere, urine culture no growth, normal renal function  Pertinent Imaging: I have personally viewed and interpreted the KUB today that shows a obvious 6 mm stone over the left UPJ.  CT report from outside hospital reviewed from 08/26/2022 showing a 6 mm left UPJ stone, images unavailable to personally review.  Assessment & Plan:   56 year old female with a 6 mm left UPJ stone, no clinical or laboratory evidence of infection.  No migration of stone on KUB today, but clearly seen on KUB.  We discussed various treatment options for urolithiasis including observation with or without medical expulsive therapy, shockwave lithotripsy (SWL), ureteroscopy and laser lithotripsy with stent placement, and percutaneous nephrolithotomy.  We discussed that management is based on stone size, location, density, patient co-morbidities, and patient preference.   Stones <85mm in size have a >80% spontaneous passage rate. Data surrounding  the use of tamsulosin for medical expulsive therapy is controversial, but meta analyses suggests it is most efficacious for distal stones between 5-72mm in size. Possible side effects include dizziness/lightheadedness, and retrograde ejaculation.  SWL has a lower stone free rate in a single procedure, but also a lower complication rate compared to ureteroscopy and avoids a stent and associated stent related symptoms. Possible complications include renal hematoma, steinstrasse, and need for additional treatment.  Ureteroscopy with laser lithotripsy and stent placement has a higher stone free rate than SWL in a single procedure, however increased  complication rate including possible infection, ureteral injury, bleeding, and stent related morbidity. Common stent related symptoms include dysuria, urgency/frequency, and flank pain.  After an extensive discussion of the risks and benefits of the above treatment options, the patient would like to proceed with left shockwave lithotripsy next week.  Oral Toradol prescription sent in case she has a return of severe renal colic, counseled to avoid this medication and all NSAIDs 48 hours prior to procedure  Legrand Rams, MD 09/01/2022  [facility] [address] [place], [place] [address] [phone]

## 2022-09-01 NOTE — Progress Notes (Signed)
ESWL ORDER FORM  Expected date of procedure: 12/7  Surgeon: Nickolas Madrid, MD  Post op standing: 2-4wk follow up w/KUB prior with PA  Anticoagulation/Aspirin/NSAID standing order: Hold all 72 hours prior  Anesthesia standing order: MAC  VTE standing: SCD's  Dx: Left Ureteral Stone  Procedure: left Extracorporeal shock wave lithotripsy  CPT : 72620  Standing Order Set:   *NPO after mn, KUB  *NS 147m/hr, Keflex 5053mPO, Benadryl 255mO, Valium 34m5m, Zofran 4mg 70m   Medications if other than standing orders:   NONE

## 2022-09-07 MED ORDER — CEPHALEXIN 500 MG PO CAPS
500.0000 mg | ORAL_CAPSULE | Freq: Once | ORAL | Status: AC
  Administered 2022-09-08: 500 mg via ORAL

## 2022-09-07 MED ORDER — SODIUM CHLORIDE 0.9 % IV SOLN: INTRAVENOUS | Status: DC

## 2022-09-07 MED ORDER — DIAZEPAM 5 MG PO TABS: 10.0000 mg | ORAL_TABLET | ORAL | Status: AC

## 2022-09-07 MED ORDER — DIPHENHYDRAMINE HCL 25 MG PO CAPS: 25.0000 mg | ORAL_CAPSULE | ORAL | Status: AC

## 2022-09-07 MED ORDER — ONDANSETRON HCL 4 MG/2ML IJ SOLN: 4.0000 mg | Freq: Once | INTRAMUSCULAR | Status: AC

## 2022-09-08 ENCOUNTER — Ambulatory Visit

## 2022-09-08 ENCOUNTER — Encounter
Admission: RE | Disposition: A | Discharge status: Home / Self Care | Payer: Self-pay | Source: Home / Self Care | Attending: Urology

## 2022-09-08 ENCOUNTER — Ambulatory Visit
Admission: RE | Admit: 2022-09-08 | Discharge: 2022-09-08 | Disposition: A | Discharge status: Home / Self Care | Attending: Urology | Admitting: Urology

## 2022-09-08 ENCOUNTER — Encounter: Payer: Self-pay | Admitting: Urology

## 2022-09-08 DIAGNOSIS — N201 Calculus of ureter: Secondary | ICD-10-CM | POA: Diagnosis present

## 2022-09-08 DIAGNOSIS — N200 Calculus of kidney: Secondary | ICD-10-CM

## 2022-09-08 DIAGNOSIS — Z79899 Other long term (current) drug therapy: Secondary | ICD-10-CM | POA: Insufficient documentation

## 2022-09-08 DIAGNOSIS — Z87891 Personal history of nicotine dependence: Secondary | ICD-10-CM | POA: Insufficient documentation

## 2022-09-08 DIAGNOSIS — Z7722 Contact with and (suspected) exposure to environmental tobacco smoke (acute) (chronic): Secondary | ICD-10-CM | POA: Diagnosis not present

## 2022-09-08 HISTORY — PX: EXTRACORPOREAL SHOCK WAVE LITHOTRIPSY: SHX1557

## 2022-09-08 SURGERY — LITHOTRIPSY, ESWL
Anesthesia: Moderate Sedation | Laterality: Left

## 2022-09-08 MED ORDER — DIPHENHYDRAMINE HCL 25 MG PO CAPS
ORAL_CAPSULE | ORAL | Status: AC
  Administered 2022-09-08: 25 mg via ORAL
  Filled 2022-09-08: qty 1

## 2022-09-08 MED ORDER — CEPHALEXIN 500 MG PO CAPS
ORAL_CAPSULE | ORAL | Status: AC
  Filled 2022-09-08: qty 1

## 2022-09-08 MED ORDER — DIAZEPAM 5 MG PO TABS
ORAL_TABLET | ORAL | Status: AC
  Administered 2022-09-08: 10 mg via ORAL
  Filled 2022-09-08: qty 2

## 2022-09-08 MED ORDER — TAMSULOSIN HCL 0.4 MG PO CAPS: 0.4000 mg | ORAL_CAPSULE | Freq: Every day | ORAL | 0 refills | Status: DC

## 2022-09-08 MED ORDER — ONDANSETRON HCL 4 MG/2ML IJ SOLN
INTRAMUSCULAR | Status: AC
  Administered 2022-09-08: 4 mg via INTRAVENOUS
  Filled 2022-09-08: qty 2

## 2022-09-08 NOTE — Discharge Instructions (Signed)
AMBULATORY SURGERY  DISCHARGE INSTRUCTIONS   The drugs that you were given will stay in your system until tomorrow so for the next 24 hours you should not:  Drive an automobile Make any legal decisions Drink any alcoholic beverage   You may resume regular meals tomorrow.  Today it is better to start with liquids and gradually work up to solid foods.  You may eat anything you prefer, but it is better to start with liquids, then soup and crackers, and gradually work up to solid foods.   Please notify your doctor immediately if you have any unusual bleeding, trouble breathing, redness and pain at the surgery site, drainage, fever, or pain not relieved by medication.    Additional Instructions:Follow Discharge sheet from Litho        Please contact your physician with any problems or Same Day Surgery at [phone], Monday through Friday 6 am to 4 pm, or [place] at [facility] number at [phone].

## 2022-09-08 NOTE — Interval H&P Note (Signed)
UROLOGY H&P UPDATE  Agree with prior H&P dated 09/01/22. LEFT 1mm UPJ stone.  Cardiac: RRR Lungs: CTA bilaterally  Laterality: LEFT Procedure: Shockwave lithotripsy  Urine: Culture 11/24 no growth  Informed consent obtained, we specifically discussed the risks of bleeding/hematoma, infection, post-operative pain/obstructive fragments/steinstrasse, need for additional procedures.  Sondra Come, MD 09/08/2022

## 2022-09-08 NOTE — Brief Op Note (Signed)
09/08/2022  9:50 AM  PATIENT:  Catherine Yoder  56 y.o. female  PRE-OPERATIVE DIAGNOSIS:  68mm Left proximal Ureteral Stone  POST-OPERATIVE DIAGNOSIS:  Same  PROCEDURE:  Procedure(s): EXTRACORPOREAL SHOCK WAVE LITHOTRIPSY (ESWL) (Left)  SURGEON:  Surgeon(s) and Role:    Sondra Come, MD - Primary  ANESTHESIA: Conscious Sedation  EBL:  None  Drains: None  Specimen: None  Findings:  Stone appeared to smudge nicely, tolerated SWL well  DISPO: Flomax, pain meds PRN, RTC 2 weeks KUB  Legrand Rams, MD 09/08/2022

## 2022-09-11 LAB — COLOGUARD: COLOGUARD: POSITIVE — AB

## 2022-09-12 ENCOUNTER — Other Ambulatory Visit: Payer: Self-pay

## 2022-09-12 DIAGNOSIS — N200 Calculus of kidney: Secondary | ICD-10-CM

## 2022-09-20 ENCOUNTER — Other Ambulatory Visit: Payer: Self-pay | Admitting: Urology

## 2022-09-30 ENCOUNTER — Ambulatory Visit
Admission: RE | Admit: 2022-09-30 | Discharge: 2022-09-30 | Disposition: A | Discharge status: Home / Self Care | Source: Ambulatory Visit | Attending: Urology | Admitting: Urology

## 2022-09-30 ENCOUNTER — Inpatient Hospital Stay: Admit: 2022-09-30

## 2022-09-30 ENCOUNTER — Ambulatory Visit: Admitting: Physician Assistant

## 2022-09-30 DIAGNOSIS — N200 Calculus of kidney: Secondary | ICD-10-CM | POA: Insufficient documentation

## 2022-10-01 ENCOUNTER — Other Ambulatory Visit: Payer: Self-pay | Admitting: Urology

## 2022-10-12 ENCOUNTER — Encounter: Payer: Self-pay | Admitting: Physician Assistant

## 2022-10-12 ENCOUNTER — Ambulatory Visit: Admitting: Physician Assistant

## 2022-10-12 VITALS — BP 133/85 | HR 75 | Ht 64.0 in | Wt 144.0 lb

## 2022-10-12 DIAGNOSIS — N201 Calculus of ureter: Secondary | ICD-10-CM | POA: Diagnosis not present

## 2022-10-12 LAB — MICROSCOPIC EXAMINATION

## 2022-10-12 LAB — URINALYSIS, COMPLETE
Bilirubin, UA: NEGATIVE
Glucose, UA: NEGATIVE
Nitrite, UA: NEGATIVE
Protein,UA: NEGATIVE
RBC, UA: NEGATIVE
Specific Gravity, UA: 1.025 (ref 1.005–1.030)
Urobilinogen, Ur: 0.2 mg/dL (ref 0.2–1.0)
pH, UA: 5.5 (ref 5.0–7.5)

## 2022-10-12 NOTE — Progress Notes (Unsigned)
10/12/2022 1:49 PM   JANYE MAYNOR Jun 25, 1966 [id number]  CC: Chief Complaint  Patient presents with   Nephrolithiasis    HPI: Catherine Yoder is a 57 y.o. female with PMH nephrolithiasis who underwent ESWL with Dr. Diamantina Providence on 09/08/2022 for management of a 6 mm proximal left ureteral stone who presents today for postop follow-up.  Operative note describes smudging of the stone.  Today she reports she had some marked dysuria 2-3 nights ago and suspected she may still be passing some stone fragments.  Her symptoms have since resolved, especially after taking Pyridium.  Notably, she started amoxicillin yesterday for a possible sinus infection.  KUB dated 09/30/2022 with interval resolution of the proximal left ureteral stone.  She is planning to transfer her medical care outside of [facility] due to ongoing billing issues.  In-office UA today positive for trace ketones and trace leukocytes; urine microscopy with 6-10 WBCs/HPF and moderate bacteria.  PMH: Past Medical History:  Diagnosis Date   Abnormal Pap smear of cervix    Anxiety    Hypertension    Kidney stones     Surgical History: Past Surgical History:  Procedure Laterality Date   ANKLE SURGERY Right    AUGMENTATION MAMMAPLASTY Bilateral    sept 2022   BREAST SURGERY     CESAREAN SECTION  1994   COLPOSCOPY     COSMETIC SURGERY     tummy tuck   EXTRACORPOREAL SHOCK WAVE LITHOTRIPSY Left 09/08/2022   Procedure: EXTRACORPOREAL SHOCK WAVE LITHOTRIPSY (ESWL);  Surgeon: Billey Co, MD;  Location: [facility] ORS;  Service: Urology;  Laterality: Left;   TUBAL LIGATION  2006    Home Medications:  Allergies as of 10/12/2022   No Known Allergies      Medication List        Accurate as of October 12, 2022  1:49 PM. If you have any questions, ask your nurse or doctor.          STOP taking these medications    buPROPion 150 MG 24 hr tablet Commonly known as: WELLBUTRIN XL Stopped by: Debroah Loop, PA-C    ezetimibe 10 MG tablet Commonly known as: ZETIA Stopped by: Debroah Loop, PA-C   ketorolac 10 MG tablet Commonly known as: TORADOL Stopped by: Debroah Loop, PA-C   tamsulosin 0.4 MG Caps capsule Commonly known as: FLOMAX Stopped by: Debroah Loop, PA-C   VITAMIN D PO Stopped by: Debroah Loop, PA-C       TAKE these medications    acetaminophen 500 MG tablet Commonly known as: TYLENOL Take 500 mg by mouth 2 (two) times daily.   B-12 PO Take by mouth.   estradiol 0.1 MG/GM vaginal cream Commonly known as: ESTRACE Use 1/2 g vaginally every night for the first 2 weeks, then use 1/2 g vaginally two or three times per week as needed to maintain symptom relief.   ibuprofen 800 MG tablet Commonly known as: ADVIL Take 800 mg by mouth 3 (three) times daily.   lisinopril 10 MG tablet Commonly known as: ZESTRIL Take 10 mg by mouth daily.        Allergies:  No Known Allergies  Family History: Family History  Problem Relation Age of Onset   Hypertension Mother    Breast cancer Mother        dx early 48s   Skin cancer Father    Tuberous sclerosis Brother    Ovarian cancer Maternal Aunt        dx 16s,  d. 76s   Breast cancer Paternal Aunt        dx 48s   Heart attack Paternal Grandfather     Social History:   reports that she quit smoking about 15 years ago. Her smoking use included cigarettes. She has been exposed to tobacco smoke. She has never used smokeless tobacco. She reports current alcohol use of about 7.0 standard drinks of alcohol per week. She reports that she does not use drugs.  Physical Exam: BP 133/85   Pulse 75   Ht 5\' 4"  (1.626 m)   Wt 144 lb (65.3 kg)   LMP 01/28/2020 Comment: BTL  BMI 24.72 kg/m   Constitutional:  Alert and oriented, no acute distress, nontoxic appearing HEENT: [place], AT Cardiovascular: No clubbing, cyanosis, or edema Respiratory: Normal respiratory effort, no increased work of  breathing Skin: No rashes, bruises or suspicious lesions Neurologic: Grossly intact, no focal deficits, moving all 4 extremities Psychiatric: Normal mood and affect  Laboratory Data: Results for orders placed or performed in visit on 07/19/22  Cytology - PAP( [place])  Result Value Ref Range   High risk HPV Negative    Adequacy      Satisfactory for evaluation; transformation zone component PRESENT.   Diagnosis      - Negative for intraepithelial lesion or malignancy (NILM)   Comment Normal Reference Range HPV - Negative    Pertinent Imaging: Results for orders placed during the hospital encounter of 09/30/22  Abdomen 1 view (KUB)  Narrative CLINICAL DATA:  Nephrolithiasis  EXAM: ABDOMEN - 1 VIEW  COMPARISON:  09/08/2022.  FINDINGS: The bowel gas pattern is normal. No radio-opaque calculi or other significant radiographic abnormality are seen.  IMPRESSION: Negative. Noncontrast CT would be much more sensitive for detecting the presence of stones, if indicated.   Electronically Signed By: Sammie Bench M.D. On: 10/02/2022 13:48   I personally reviewed the images referenced above and note interval clearance of the proximal left ureteral stone.  Assessment & Plan:   1. Left ureteral stone Full clearance on KUB.  She had some dysuria several nights ago, which has resolved on its own in the setting of empiric amoxicillin use.  Her UA today has pyuria and bacteriuria consistent with possible cystitis.  I offered her urine culture, however she declined this due to ongoing billing issues with [place] as above.  Okay to follow-up with [place] as needed.  I offered her a urology referral outside our healthcare system, but she declined. - Urinalysis, Complete  Return if symptoms worsen or fail to improve.  Debroah Loop, PA-C  [facility] [address], [place], [place] [address] [phone]

## 2022-11-25 IMAGING — MG MM DIGITAL SCREENING BILAT W/ TOMO AND CAD
8 series · 8 of 24 positions shown · non-contrast
Comparison: Previous exam(s).

CLINICAL DATA: Screening.

EXAM:
DIGITAL SCREENING BILATERAL MAMMOGRAM WITH TOMOSYNTHESIS AND CAD
TECHNIQUE: Bilateral screening digital craniocaudal and mediolateral oblique
mammograms were obtained. Bilateral screening digital breast
tomosynthesis was performed. The images were evaluated with
computer-aided detection.

[R CC synth-2D]
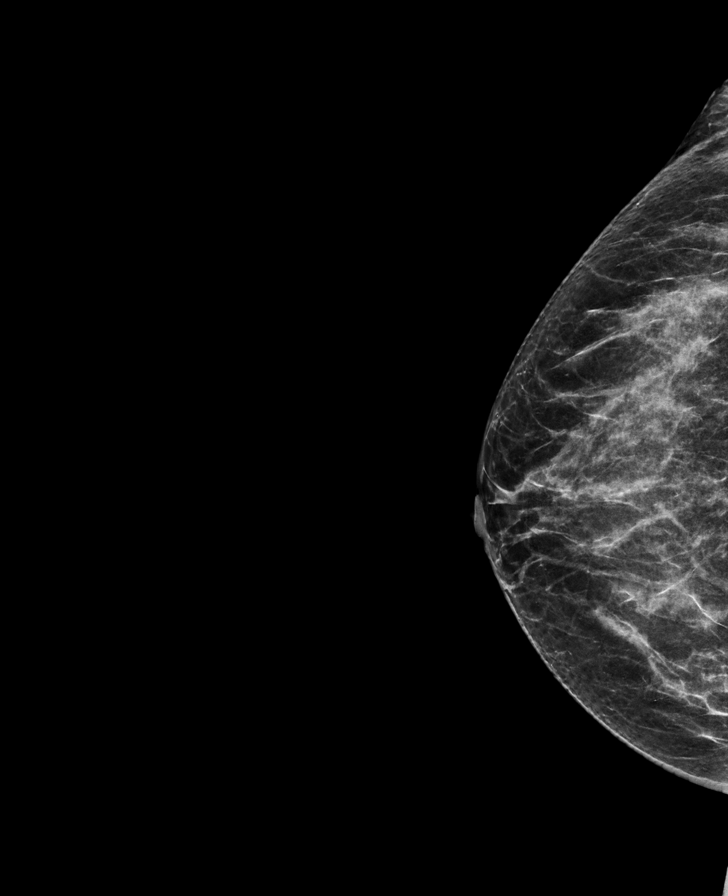

[L CC synth-2D]
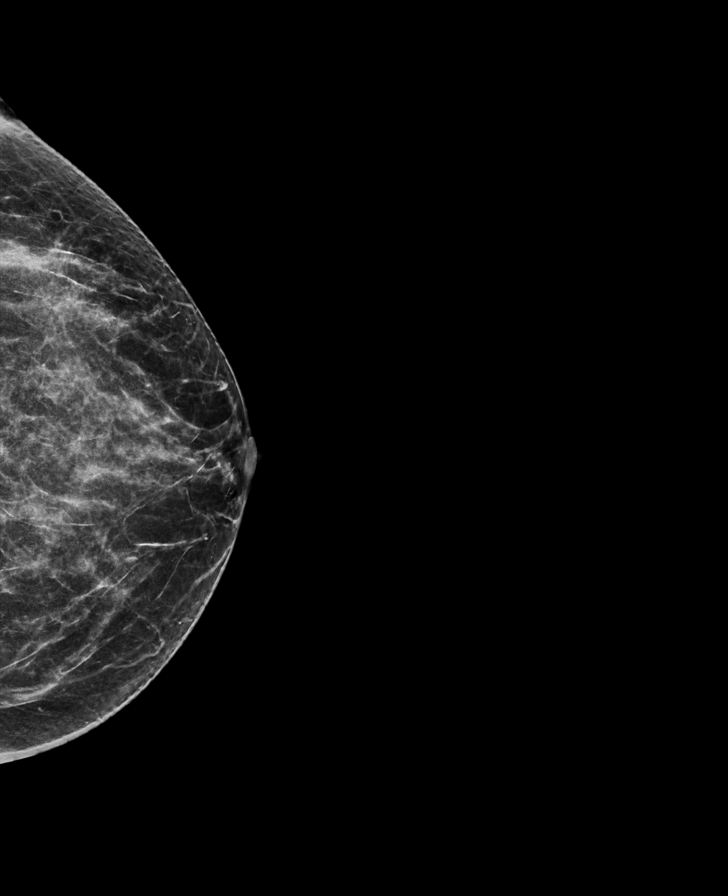

[L MLO synth-2D]
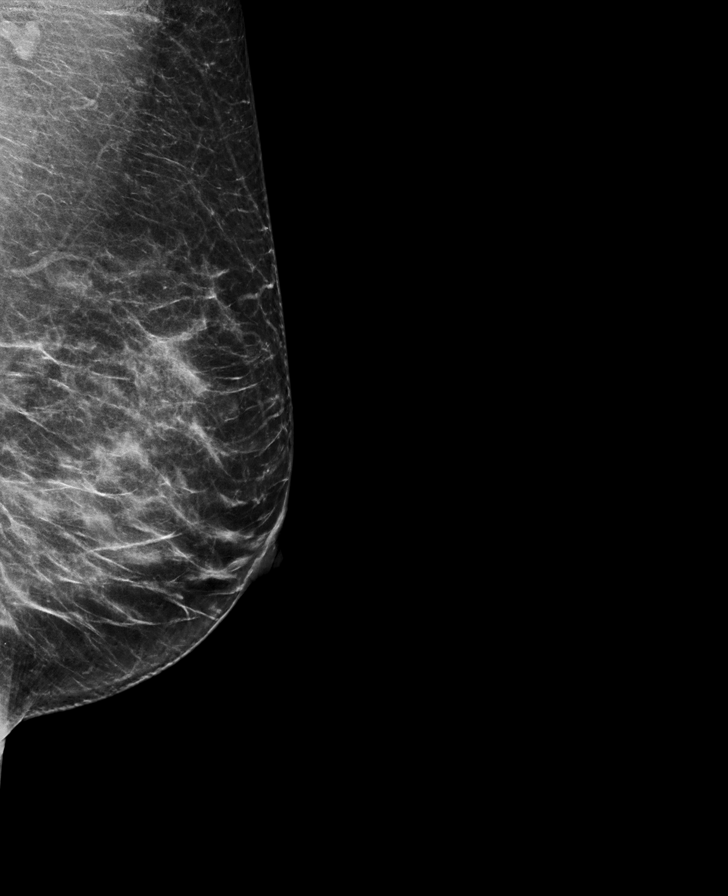

[R MLO synth-2D]
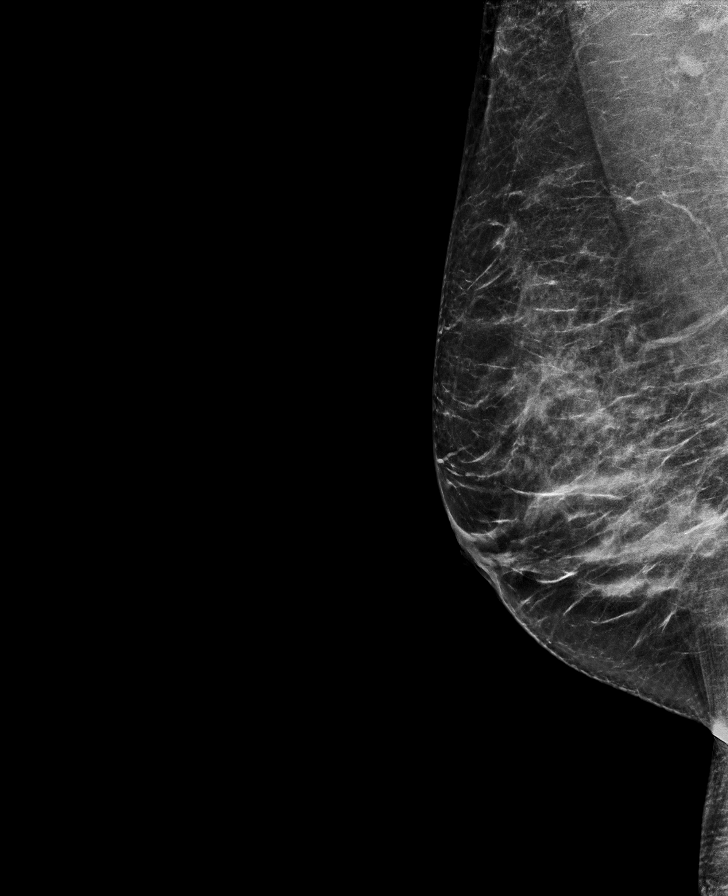

[L MLO tomo · tomo slice 35/70.0]
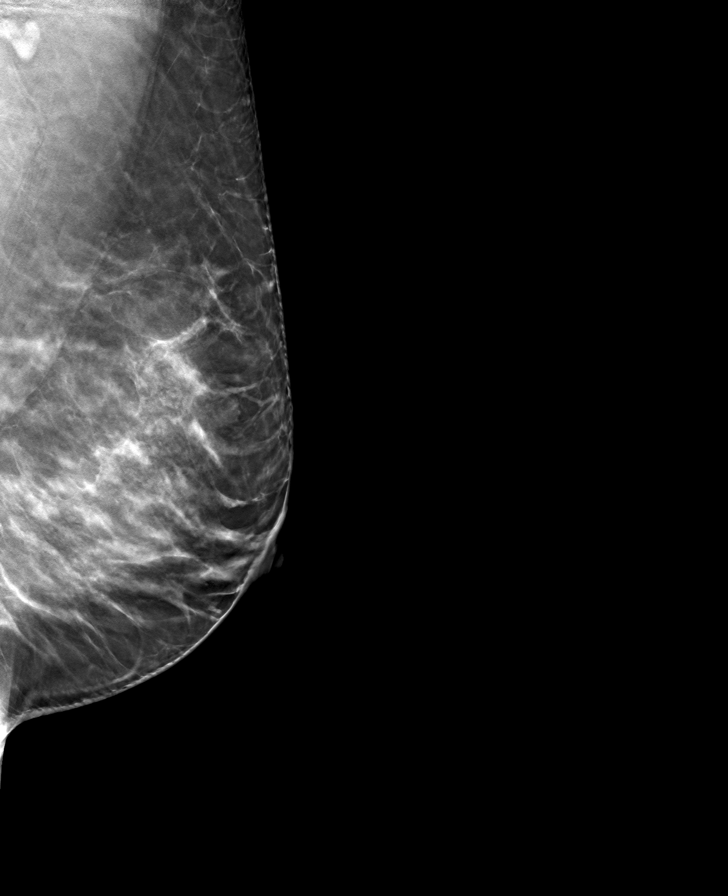

[R MLO tomo · tomo slice 33/66.0]
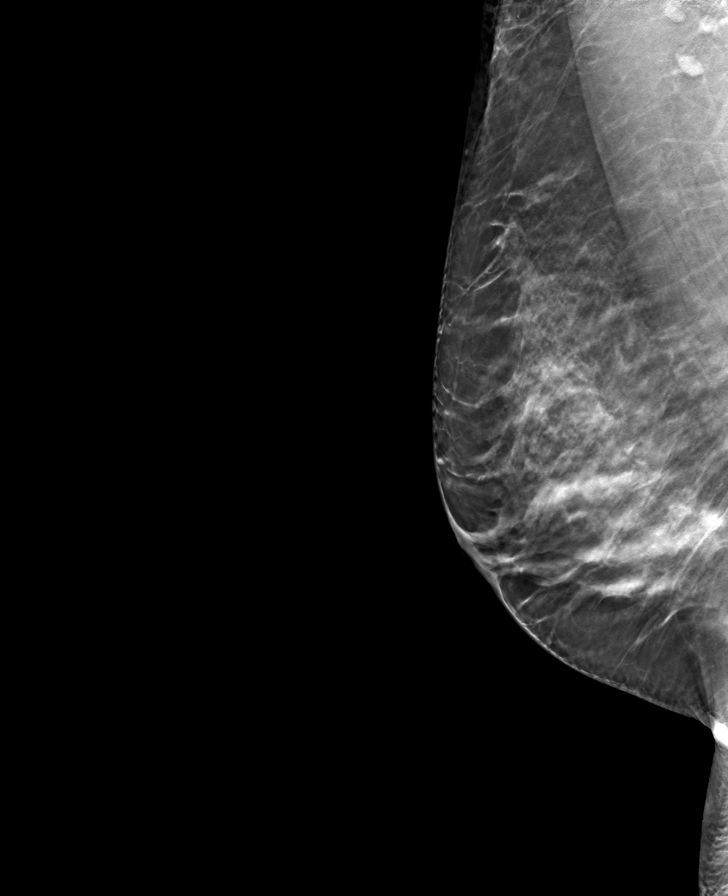

[L CC tomo · tomo slice 30/59.0]
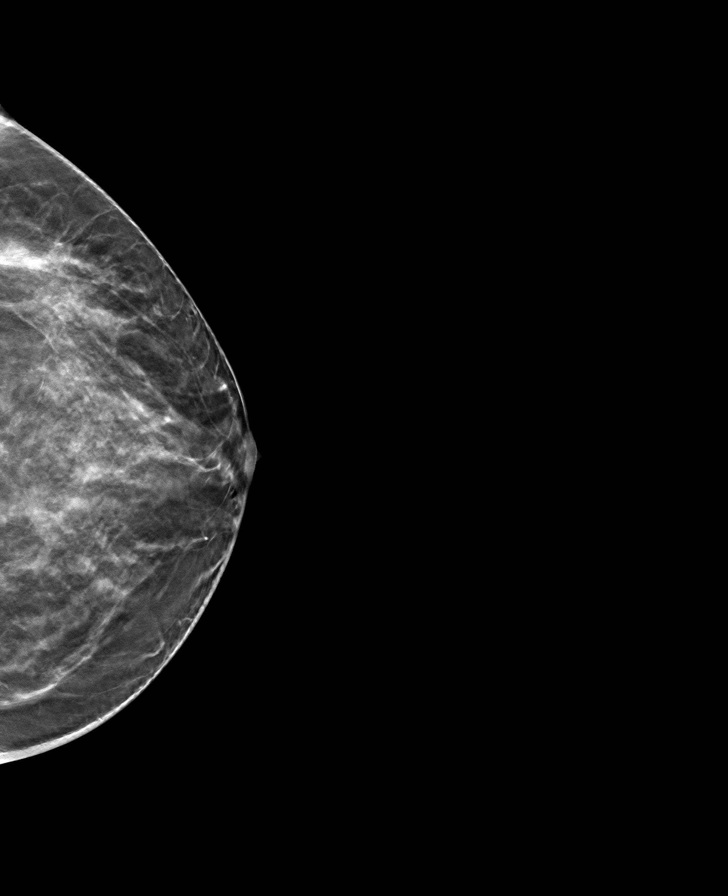

[R CC tomo · tomo slice 30/59.0]
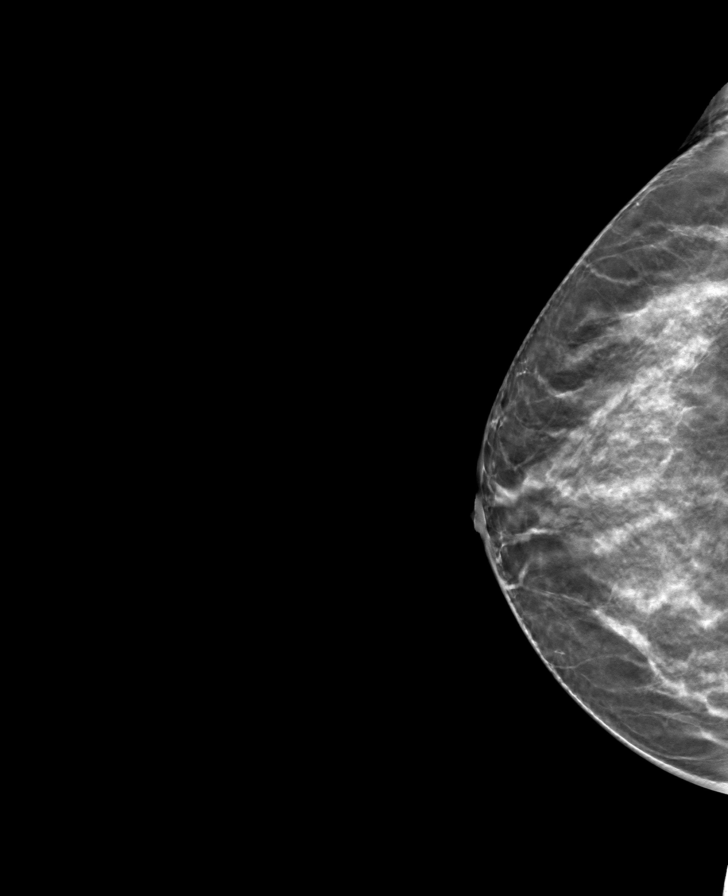

[8 of 24 positions shown; findings below may reference images not displayed]

ACR Breast Density Category c: The breast tissue is heterogeneously
dense, which may obscure small masses.
FINDINGS: There are no findings suspicious for malignancy. The images were
evaluated with computer-aided detection.
IMPRESSION: No mammographic evidence of malignancy. A result letter of this
screening mammogram will be mailed directly to the patient.

RECOMMENDATION:
Screening mammogram in one year. (Code:T4-5-GWO)

BI-RADS CATEGORY  1: Negative.

## 2022-12-12 ENCOUNTER — Ambulatory Visit

## 2022-12-12 DIAGNOSIS — R195 Other fecal abnormalities: Secondary | ICD-10-CM | POA: Diagnosis not present

## 2022-12-12 DIAGNOSIS — K621 Rectal polyp: Secondary | ICD-10-CM | POA: Diagnosis not present

## 2022-12-12 DIAGNOSIS — K641 Second degree hemorrhoids: Secondary | ICD-10-CM | POA: Diagnosis not present

## 2022-12-12 DIAGNOSIS — Z1211 Encounter for screening for malignant neoplasm of colon: Secondary | ICD-10-CM | POA: Diagnosis present

## 2023-05-09 ENCOUNTER — Other Ambulatory Visit: Payer: Self-pay

## 2023-05-09 MED ORDER — ESTRADIOL 0.1 MG/GM VA CREA: TOPICAL_CREAM | VAGINAL | 0 refills | Status: AC

## 2023-05-09 NOTE — Telephone Encounter (Signed)
Medication refill request: estrace vaginal cream Last AEX:  07-19-22 Next AEX: not scheduled Last MMG (if hormonal medication request): 08-13-22 category c density birads 1:neg Refill authorized: message sent to scheduling department to call patient to schedule aex. Please approve if appropriate

## 2024-05-15 ENCOUNTER — Other Ambulatory Visit: Payer: Self-pay | Admitting: Obstetrics and Gynecology

## 2024-05-15 DIAGNOSIS — Z1231 Encounter for screening mammogram for malignant neoplasm of breast: Secondary | ICD-10-CM

## 2024-05-15 DIAGNOSIS — N6325 Unspecified lump in the left breast, overlapping quadrants: Secondary | ICD-10-CM

## 2024-05-17 ENCOUNTER — Encounter

## 2024-05-17 ENCOUNTER — Ambulatory Visit
Admission: RE | Admit: 2024-05-17 | Discharge: 2024-05-17 | Disposition: A | Discharge status: Home / Self Care | Source: Ambulatory Visit | Attending: Obstetrics and Gynecology | Admitting: Obstetrics and Gynecology

## 2024-05-17 ENCOUNTER — Other Ambulatory Visit

## 2024-05-17 DIAGNOSIS — N6325 Unspecified lump in the left breast, overlapping quadrants: Secondary | ICD-10-CM | POA: Insufficient documentation

## 2024-05-17 DIAGNOSIS — Z1231 Encounter for screening mammogram for malignant neoplasm of breast: Secondary | ICD-10-CM | POA: Diagnosis present

## 2024-05-20 ENCOUNTER — Other Ambulatory Visit: Payer: Self-pay | Admitting: Obstetrics and Gynecology

## 2024-05-20 DIAGNOSIS — R928 Other abnormal and inconclusive findings on diagnostic imaging of breast: Secondary | ICD-10-CM

## 2024-05-22 ENCOUNTER — Ambulatory Visit
Admission: RE | Admit: 2024-05-22 | Discharge: 2024-05-22 | Disposition: A | Discharge status: Home / Self Care | Source: Ambulatory Visit | Attending: Obstetrics and Gynecology | Admitting: Obstetrics and Gynecology

## 2024-05-22 ENCOUNTER — Inpatient Hospital Stay
Admission: RE | Admit: 2024-05-22 | Discharge: 2024-05-22 | Discharge status: Home / Self Care | Source: Ambulatory Visit | Attending: Obstetrics and Gynecology | Admitting: Obstetrics and Gynecology

## 2024-05-22 DIAGNOSIS — C773 Secondary and unspecified malignant neoplasm of axilla and upper limb lymph nodes: Secondary | ICD-10-CM | POA: Diagnosis present

## 2024-05-22 DIAGNOSIS — R928 Other abnormal and inconclusive findings on diagnostic imaging of breast: Secondary | ICD-10-CM

## 2024-05-22 DIAGNOSIS — C50412 Malignant neoplasm of upper-outer quadrant of left female breast: Secondary | ICD-10-CM | POA: Diagnosis present

## 2024-05-22 HISTORY — PX: BREAST BIOPSY: SHX20

## 2024-05-22 MED ORDER — LIDOCAINE 1 % OPTIME INJ - NO CHARGE
1.0000 mL | Freq: Once | INTRAMUSCULAR | Status: AC
  Administered 2024-05-22: 1 mL via INTRADERMAL
  Filled 2024-05-22: qty 2

## 2024-05-22 MED ORDER — LIDOCAINE-EPINEPHRINE 1 %-1:100000 IJ SOLN
5.0000 mL | Freq: Once | INTRAMUSCULAR | Status: AC
  Administered 2024-05-22: 5 mL

## 2024-05-23 ENCOUNTER — Encounter: Payer: Self-pay | Admitting: *Deleted

## 2024-05-23 LAB — SURGICAL PATHOLOGY

## 2024-05-23 NOTE — Progress Notes (Signed)
 Received referral for newly diagnosed breast cancer from [facility].  Navigation initiated.  Per patient [place] is out of network and she must be referred to [facility].   Referral faxed to [facility] breast clinic.

## 2024-09-10 NOTE — Progress Notes (Signed)
 [facility]  Breast Medical Oncology  09/05/2024     Catherine Yoder [id number] [place] [address] Breast surgeon: Dr. Ginny Maillard Radiation oncologist: Dr. Lauraine Amble Catherine Yoder is a lovely 58 y.o. 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  ONCOLOGIC HISTORY: Left breast mass 05/17/24 Bilateral dx mmg ([facility]): left breast architectural distortion and microcalcifications at 12:00 spanning 8 cm, right breast normal 05/17/24 Left breast US : multiple masses (7 mm, 1.5 cm, 1.9 cm, 1.0 cm, 1.4 cm) 2 enlarged left axillary LN  05/22/24 Pathology ([facility]) Left breast bx (retroareolar 8:30) IDC with apocrine features grade 2 Left breast bx (200 7 cfn): IDC with apocrine features, grade 2 ER 0% PR 0% HER2 IHC 0  Left axilla: IDC grade 2 receptors not performed  TODAY 09/05/2024: She presents to establish medical oncology care.   INTERVAL HISTORY: Catherine Yoder is seen as an add on today for evaluation since having her treatment HELD last week on 08/28/24 for elevated LFTs.  She was started on a prednisone dose of 40mg  day.  She has felt well all week, attributing this to her steroids.  Today her LFTs are improved.  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  Hepatitis panel was done today as well as HSV PCR.   She denies pruritic skin. No issues with bowel or bladder elimination.    She reports that the bumps on her clitoral area have not evolved into open lesions.  They are not symptomatic with pruritus or burning.     ROS: denies bone pain, shortness of breath, vomiting, diarrhea, headaches, new neurologic symptoms, unintentional weight loss. No breast concerns. No lumps, bumps, rashes or skin changes.  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   There is no mammographic evidence of malignancy in the RIGHT breast.   A palpable lump in the RETROAREOLAR LEFT breast is marked. Spot compression views demonstrate no obvious RETROAREOLAR abnormality in the area of palpable concern indicated by the patient.   Multiple new abnormalities throughout the superior and LATERAL LEFT breast, including architectural distortion with associated  microcalcifications at 12 o'clock, and additional areas of  distortion with likely obscured masses throughout the upper-outer  quadrant. The abnormalities span at least 8 cm. Targeted ultrasound was performed.   At the LEFT breast RETROAREOLAR 8:30 position at the site of  palpable abnormality identified by the patient there is an oval,  hypoechoic mass with indistinct margins abutting the areola/skin  which is indeterminate and correlates with the palpable abnormality. It measures 7 x 3 x 6 mm.   Multiple suspicious masses are noted in the LEFT breast at 12  o'clock and in the upper-outer quadrant, including: An oval,  hypoechoic mass with indistinct margins and possible associated  microcalcifications at 12 o'clock, 4 cm from the nipple measuring 15  x 8 x 14 mm; a taller than wide, irregular hypoechoic mass at 1  o'clock 3 cm from the nipple measuring 19 x 12 x 15 mm; an  irregular, hypoechoic mass with indistinct margins at 12:30  position, 3 cm from the nipple measuring 9 x 7 x 10 mm; and a   spiculated, round hypoechoic mass at 2 o'clock 7 cm from the nipple measuring 12 x 14 x 13 mm. At least 2 enlarged lymph nodes were seen in the LEFT axilla, with representative pictures taken.   IMPRESSION:  1. Multiple highly suspicious abnormalities throughout the LEFT  breast spanning at least 8 cm, including multiple masses in the  upper-outer quadrant, a superficial mass at the site of palpable  abnormality in the periareolar breast, suspicious  microcalcifications and distortion on mammogram, and enlarged LEFT axillary lymph nodes. Recommend ultrasound guided biopsy of the 7 mm palpable mass at 8:30 RETROAREOLAR position; the spiculated 14 mm mass at 2 o'clock 7 cm from the nipple; and 1 of the enlarged LEFT axillary lymph nodes.   2. Recommend management of the additional findings in the LEFT  breast, including the 3 additional LEFT breast masses seen on  mammogram and the suspicious microcalcifications at 12 o'clock, be based on the results of the above biopsies and the clinical plan.   3. Given the extent of abnormality, if the above biopsies  demonstrate cancer, further evaluation with breast MRI with and  without contrast should be performed.   4. There is no mammographic evidence of malignancy in the RIGHT breast.   RECOMMENDATION:  Ultrasound-guided biopsy of the LEFT breast and axilla x3.   BI-RADS CATEGORY  5: Highly suggestive of malignancy.   [place]    05/22/2024 Biopsy   1. 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,       GRADE 2 (3+2+1)       DUCTAL CARCINOMA IN SITU, INTERMEDIATE TO HIGH NUCLEAR GRADE, SOLID AND       CRIBRIFORM TYPES WITHOUT NECROSIS SHOWING CANCERIZATION OF LOBULES       TUBULE FORMATION: SCORE 3       NUCLEAR PLEOMORPHISM: SCORE 2       MITOTIC COUNT: SCORE 1       TOTAL SCORE: 6       OVERALL GRADE: GRADE 2 (6/9)       ANGIOLYMPHATIC INVASION NOT IDENTIFIED       PERINEURAL INVASION PRESENT  BACKGROUND BREAST SHOWS STROMAL FIBROSIS AND ADENOSIS       MICROCALCIFICATIONS PRESENT WITHIN INVASIVE TUMOR       INVASIVE TUMOR MEASURES 8.5 MM IN GREATEST LINEAR EXTENT  The tumor cells are NEGATIVE for Her2 (0).  Estrogen Receptor:  0%, NEGATIVE  Progesterone Receptor:  0%, NEGATIVE   3. Lymph node, needle/core biopsy, left axilla, hydromark spiral clip :METASTATIC MODERATELY DIFFERENTIATED DUCTAL ADENOCARCINOMA   [place]   06/20/2024 -  Chemotherapy   OP A Breast PACLitaxel 80mg /m2 IV CARBOplatin AUC 1.5 IV Pembrolizumab 200 mg IV DOXOrubicin 60mg /m2 IV cyclophosphamide 600mg /m2 IV pegfilgrastim 6mg  [place] 21 day Plan Provider: Abirami Natarajan, MD Treatment goal: Curative Line of treatment: [No plan line of treatment]      GYNECOLOGIC HISTORY     BREAST CANCER MULTIDISCIPLINARY       05/29/2024   08:57  Breast Center Questionnaire  What type of visit will you be having? New Visit for Breast Care  Breast Related Current Complaint New Diagnosis of Breast Cancer (includes Ductal Carcinoma In Situ or DCIS)  How long have you had symptoms? 2-6 months  Which breast has a problem? Left  How was the problem found? Self Exam  What changes have  you noticed? (Select all that apply) Lump   Tenderness  What was the date of your last mammogram? 05/22/2024  Last Mammogram Imaging Center Location: [facility] [place]  Did you have Chemotherapy? No  Did you have Endocrine (Anti-Hormone) therapy? No  Have you ever had radiation therapy? No  Are you of Ashkenazi Jewish ancestry? No  Have you had genetic testing (using blood or saliva) to look for a change (or mutation) that would put you at higher risk for getting certain cancers? No  Have you had a bone density study (for osteoporosis)? No  Have you had a colonoscopy (to look for colon cancer)? Yes  Have you had a menstrual period within the past six months? No  Are you concerned about maintaining fertility? No  Age when periods stopped 54  Why did your periods stop? Natural Menopause  If Post-Menopausal, have you taken Hormone Replacment Therapy (HRT)? No  Age of starting menstrual period (Menarche) 12  Any prior pregnancies? Yes  Number of pregnancies 1  Number of live births 1  What was your age of your first live birth? 89  Did you breastfeed? Yes  Approximate breastfeeding months total 7-12 months  How long ago did you stop breastfeeding? greater than 3 months ago  Have you ever used Oral Contraceptives? Yes  Approximate years of Oral Contraceptive Use 1-2 years  Have you ever used an IUD (intrauterine device)? No  Have you ever used fertility drugs? No  Do you exercise regularly? No  Do you follow any particular diet? No    PMH Past Medical History:  Diagnosis Date   Chronic constipation    GERD (gastroesophageal reflux disease)    Hyperlipidemia 2020   Hypertension 2015   Kidney stone    Pancreatitis (HHS-HCC)      PSH Past Surgical History:  Procedure Laterality Date   COLONOSCOPY  02/06/2018   Adenomatous Polyps; FHCP (Father): CBF 01/2021   Open reduction and internal fixation of a right bimalleolar ankle fracture.  Right 03/11/2020    Dr. Oneil Gully, M.D. at [facility] [place]  Orthopedic and Sports Med   Colon @ [facility]  12/12/2022   Hyperplastic polyp/PHx CP/FHx CP/Repeat 26yrs/TKT   AUGMENTATION MAMMAPLASTY  10/17/2020   BREAST BIOPSY  05/22/2024   BREAST SURGERY  2022   CESAREAN SECTION  1994   COLONOSCOPY     FH Colon Polyps (Father)   FRACTURE SURGERY  2021   LYMPH NODE BIOPSY  05/22/2024   TUBAL LIGATION  2008    FH  Family History  Problem Relation Age of Onset   Breast cancer Mother 46       DCIS s/p bilat mast   High blood pressure (Hypertension) Mother    Ulcerative colitis Mother    Dementia Mother    Myocardial Infarction (Heart attack) Father    Skin cancer Father    Colon polyps Father    Skin cancer Brother    Ovarian cancer Maternal Aunt 27   Breast cancer Paternal Aunt    Migraines Daughter      Social History: Social History   Socioeconomic History   Marital status: Married    Spouse name: Prentice   Number of children: 1   Highest education level: Bachelor's degree (e.g., BA, AB, BS)  Occupational History   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  Tobacco Use   Smoking status: Former    Current packs/day: 0.00    Types: Cigarettes    Start date: 10/03/1978    Quit date: 07/04/2004    Years since quitting: 20.2    Passive exposure: Never   Smokeless tobacco: Never  Vaping Use   Vaping status: Never Used  Substance and Sexual Activity   Alcohol use: Yes    Alcohol/week: 4.0 standard drinks of alcohol   Drug use: Never   Sexual activity: Yes    Partners: Male    Birth control/protection: Post-menopausal    Comment: Husband - Fred  Other Topics Concern   Would you please tell us  about the people who live in your home, your pets, or anything else important to your social life? No  Social History Narrative   Married to [place]. Adult dtr 31 yrs living in [place] . 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 Now working for [employer] in person full time. Lives in [place]Exercise: walking a few times per week x 3 miles. Ex smoker. Quit in 2005. (06/05/2024).    Social Drivers of Corporate Investment Banker Strain: Low Risk  (06/06/2024)   Overall Financial Resource Strain (CARDIA)    Difficulty of Paying Living Expenses: Not hard at all  Food Insecurity: No Food Insecurity (06/06/2024)   Hunger Vital Sign    Worried About Running Out of Food in the Last Year: Never true    Ran Out of Food in the Last Year: Never true  Transportation Needs: No Transportation Needs (06/06/2024)   PRAPARE - Administrator, Civil Service (Medical): No    Lack of Transportation (Non-Medical): No  Physical Activity: Sufficiently Active (06/06/2024)   Exercise Vital Sign    Days of Exercise per Week: 3 days    Minutes of Exercise per Session: 60 min  Stress: Stress Concern Present (06/06/2024)   [employer] of Occupational Health - Occupational Stress Questionnaire    Feeling of Stress : To some extent  Social Connections: Moderately Integrated (06/06/2024)   Social Connection and Isolation Panel    Frequency of Communication with Friends and Family: More than three times a week    Frequency of Social Gatherings with Friends and Family: Three times a week    Attends Religious Services: Never    Active  Member of Clubs or Organizations: Yes    Attends Engineer, Structural: More than 4 times per year    Marital Status: Married  Housing Stability: Low Risk  (06/11/2024)   Housing Stability Vital Sign    Unable to Pay for Housing in the Last Year: No    Number of Times Moved in the Last Year: 0    Homeless in the Last Year: No     Allergies: No Known Allergies   Current Medications: Current Outpatient Medications  Medication Sig Dispense Refill   acetaminophen (TYLENOL) 500 MG tablet Take by mouth     docusate sodium (COLACE ORAL) Take by mouth      estradioL  (ESTRACE ) 0.01 % (0.1 mg/gram) vaginal cream Place pea-sized amount vaginally every night at bedtime for 2 weeks, then twice weekly for 3 months 42.5 g 0   filgrastim-sndz (ZARXIO) 300 mcg/0.5 mL injection syringe Inject 0.5 mLs (300 mcg total) subcutaneously once daily for 3 days 24 hours after completion of chemotherapy 4.5 mL 1   lidocaine -prilocaine (EMLA) cream Apply topically once for 1 dose 1 hour prior to port access. Cover with thin film. 30 g 1   lisinopriL (ZESTRIL) 10 MG tablet TAKE 1 TABLET(10 MG) BY MOUTH DAILY 90 tablet 1   loratadine (CLARITIN) 10 mg tablet Take 1 tablet (10 mg total) by mouth once daily 30 tablet 1   MULTIVITAMIN ORAL Take by mouth     naproxen sodium (ALEVE) 220 MG tablet Take 220 mg by mouth as needed for Pain     ondansetron  (ZOFRAN ) 8 MG tablet Take 1 tablet (8 mg total) by mouth every 12 (twelve) hours as needed for Nausea 30 tablet 3   predniSONE (DELTASONE) 10 MG tablet Take 4 tablets (40 mg total) by mouth once daily for 14 days 56 tablet 0   prochlorperazine (COMPAZINE) 10 MG tablet Take 1 tablet (10 mg total) by mouth every 6 (six) hours as needed for Nausea or Vomiting 30 tablet 3   traZODone (DESYREL) 50 MG tablet Take 1 tablet (50 mg total) by mouth at bedtime 30 tablet 1   clindamycin (CLEOCIN T) 1 % topical gel Apply topically 2 (two) times daily (Patient not taking: Reported on 09/05/2024) 60 g 0   [START ON 10/01/2024] dexAMETHasone (DECADRON) 4 MG tablet Take 2 tablets (8 mg) by mouth daily for 3 days following chemotherapy for the first 4 treatment cycles. 30 tablet 0   [START ON 10/01/2024] OLANZapine (ZYPREXA) 5 MG tablet Take 1 tablet (5 mg) by mouth at night for 4 nights starting the day of chemotherapy for the first 4 treatment cycles. 30 tablet 1   No current facility-administered medications for this visit.   Facility-Administered Medications Ordered in Other Visits  Medication Dose Route Frequency Provider Last  Rate Last Admin   medication message - CRYOTHERAPY   XX As Directed Natarajan, Abirami, MD            Physical Exam: BP 121/76 (BP Location: Left upper arm, Patient Position: Sitting, BP Cuff Size: Adult)   Pulse 72   Temp 36.7 C (98.1 F) (Oral)   Resp 18   Wt 71.8 kg (158 lb 4.6 oz)   LMP 09/29/2017 (Exact Date)   SpO2 99%   BMI 27.02 kg/m Body surface area is 1.8 meters squared.  ECOG Performance Status: 0 General appearance: Appears well, in no acute distress. Scalp- scattered mildly erythematous papules on scalp.Per pt are much improved from previous HEENT: on tongue,  there is a very mild white plaque  Skin: Color and texture normal. No visible rashes.  Neurologic: alert and oriented, non focal   Laboratory:  Results for orders placed or performed in visit on 09/05/24  Herpes Simplex Virus (HSV), Quant PCR - [facility]   Specimen: Serum; Other  Result Value Ref Range   HSV 1 qPCR (serum) 8500 Not Detected Not Detected   HSV 2 qPCR (serum) 8500 Not Detected Not Detected  Complete Blood Count (CBC) with Differential  Result Value Ref Range   WBC (White Blood Cell Count) 7.8 3.2 - 9.8 x109/L   Hemoglobin 12.3 11.7 - 15.5 g/dL   Hematocrit 63.1 64.9 - 45.0 %   Platelets 112 (L) 150 - 450 x109/L   MCV (Mean Corpuscular Volume) 97 80 - 98 fL   MCH (Mean Corpuscular Hemoglobin) 32.4 26.5 - 34.0 pg   MCHC (Mean Corpuscular Hemoglobin Concentration) 33.4 31.0 - 36.0 %   RBC (Red Blood Cell Count) 3.80 3.77 - 5.16 x1012/L   RDW-CV (Red Cell Distribution Width) 17.8 (H) 11.5 - 14.5 %   NRBC (Nucleated Red Blood Cell Count) 0.00 0 x109/L   NRBC % (Nucleated Red Blood Cell %) 0.0 %   MPV (Mean Platelet Volume) 10.9 7.2 - 11.7 fL   Neutrophil Count 5.4 2.0 - 8.6 x109/L   Neutrophil % 69.1 37 - 80 %   Lymphocyte Count 1.9 0.6 - 4.2 x109/L   Lymphocyte % 23.8 10 - 50 %   Monocyte Count 0.5 0 - 0.9 x109/L   Monocyte % 6.5 0 - 12 %   Eosinophil Count 0.00 0 - 0.70 x109/L    Eosinophil % 0.0 0 - 7 %   Basophil Count 0.01 0 - 0.20 x109/L   Basophil % 0.1 0 - 2 %   Immature Granulocyte Count 0.04 <=0.06 x109/L   Immature Granulocyte % 0.5 <=0.7 %   Immature Platelet Fraction 5.3 1.6 - 8.5 %  Comprehensive Metabolic Panel (CMP)  Result Value Ref Range   Sodium 140 135 - 145 mmol/L   Potassium 3.8 3.5 - 5.0 mmol/L   Chloride 108 98 - 108 mmol/L   Carbon Dioxide (CO2) 24 21 - 30 mmol/L   Urea Nitrogen (BUN) 17 7 - 20 mg/dL   Creatinine 0.5 0.4 - 1.0 mg/dL   Glucose 92 70 - 859 mg/dL   Calcium 9.3 8.7 - 89.7 mg/dL   AST (Aspartate Aminotransferase) 27 15 - 41 U/L   ALT (Alanine Aminotransferase) 74 (H) 10 - 39 U/L   Bilirubin, Total 1.3 0.4 - 1.5 mg/dL   Alk Phos (Alkaline Phosphatase) 89 24 - 110 U/L   Albumin 3.9 3.5 - 4.8 g/dL   Protein, Total 6.4 6.2 - 8.1 g/dL   Anion Gap 8 3 - 12 mmol/L   BUN/CREA Ratio 34 (H) 6 - 27   Glomerular Filtration Rate (eGFR)  109 mL/min/1.73sq m  Magnesium  Result Value Ref Range   Magnesium 2.0 1.8 - 2.5 mg/dL  Hepatitis Panel, Acute  Result Value Ref Range   Hepatitis A IgM, Antibody Negative Negative   Hepatitis B Core IgM Antibody NonReactive NonReactive   Hepatitis B Surface Antigen NonReactive NonReactive   Hepatitis C Virus Antibody NonReactive NonReactive   Narrative   Performed by Chemiluminescent Immunoassay     Imaging, labs and pathology personally reviewed and discussed with patient.  ASSESSMENT AND PLAN   I discussed with Rachal Dvorsky her new diagnosis of clinical stage triple negative breast  cancer. I reviewed her pathology report in detail including tumor size, grade, receptor status of her tumor pathology, and the implications of these for treatment decision-making. In terms of treatment, we broadly reviewed the different types of local therapy (surgery, radiation) and systemic therapy (in her case, chemotherapy and immunotherapy) that may be used as a part of a curative approach to breast  cancer treatment. We reviewed the goals of each therapeutic modality.  In terms of local therapy she has met with Dr. Betha and will meet with Dr. Lorren.  Discussed that for tumors > 2 cm or node positive tumors we standardly pursue neoadjuvant chemoimmunotherapy based on the KN522 trial. This regimen consists of weekly paclitaxel + carboplatin for  12 weeks followed by anthryacycline and cyclophosphamide every [redacted] weeks along with pembrolizumab every 3 weeks (5 months in the neoadjuvant setting).  Surgery is completed following chemotherapy, and radiation following surgery.  Discussed tailoring adjuvant systemic therapy based on response seen at the time of surgery. If there is complete pathologic response standard of care is 9 cycles of adjuvant pembrolizumab (~7 months). If there is residual disease after sugery we typically utilize capecitabine with pembrolizumab, and consider incorporation of olaparib in BRCA mutation carriers.   Reviewed side effect profile of KN522 and schedule.     Cancer Staging <redacted file path>  Breast cancer metastasized to axillary lymph node, left (CMS/HHS-HCC) Staging form: Breast, AJCC 8th Edition - Clinical stage from 05/22/2024: Stage IIIB (cT3, cN1(f), cM0, G2, ER-, PR-, HER2-) - Signed by Keenan Christobal Ditty, NP on 06/04/2024    #Invasive Breast Cancer, clinical TNBC - no history of active autoimmune disease - cardiac history: HTN, HLD.  06/07/24 echo EF 62% and GLS -18.1% - 06/11/24 staging CT CAP and bone scan negative for distant metastatic disease. - 06/12/24 PORT placed.  - path review requested on 9/1 specifically LN  - [id number] teaching by Pharmacist, and consent completed 06/20/24. - 06/07/24 breast MRI shows 9 x 6.6 x 5.6 cm area of suspicious enhancement.  - punch bx 06/05/24 negative for malignancy. - we discussed the difference between clinical and pathologic staging.  We also reviewed how dermal clinical and pathologic presentation affects tumor  staging.  Reviewed TNM classification and prognostic factors used in determining stage.  - she is clinically and hemodynamically stable to pursue C3D1 [id number] treatment today. However, given the decrease in her blood counts, most noticeably ANC of 1.4 today, 08/01/2024, we will start GCSF. Able to obtain authorization same day and pt will pick up Rx [facility] at [facility]. Reviewed how to use and potential side effects. 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 Will also send claritin to pharmacy today - C4D8 HELD 09/27/24 due to transaminitis.  Started on 0.5mg /kg steroid (40mg  daily). Will recheck LFTs, and send hepatitis panel next week (was NOT able to be added on today per lab).  - ADDENDUM 09/05/24 hepatitis panel negative.  - Steroid taper please see below.  She will be treated today 09/05/24 with TC treatment.  - For Phase 2 of [id number], plan start 10/01/24.  At this time, continue every 3 week dosing of Pembrolizumab pending LFT recovery off steroids.  She would like to self inject Neulasta for D@; [facility]  # Chemotherapy-induced liver toxicity Elevated liver function tests today 09/05/2024 in absence of alcohol or tylenol use. Differential includes taxane and/or immunotherapy-induced liver toxicity.  - Prescribed on 08/28/24 Decadron 40 mg for one week, given two week supply  with plan for taper or continuance of 40mg  pending LFTs next week. - Will recheck liver function tests next week.   Will add on hepatitis panel. - Avoid alcohol and acetaminophen. - 09/05/24 LFTs improved.  ALT elevated but decreased from 207. Lab Results  Component Value Date   ALT 74 (H) 09/05/2024   AST 27 09/05/2024   ALKPHOS 89 09/05/2024     Will taper steroid starting tomorrow 09/06/24 to 20mg /d x7 then 10mg /d x5-7days pending repeat labs next week.  - ADDENDUM 09/05/24 hepatitis panel negative.   #Genetics: - completed today   #Depression - discussed needing to taper down wellbutrin given DDI with  anthracycline - recently started on 05/28/24  - will work with PCP to start alternative antidepressant  #HTN - previously on lisinopril  - BP normally 130s at home - will discuss BP optimization with PCP  - 06/07/24 ECHO completed.   #Vaccination - Received flu and COVID vaccines  # Folliculitis of the scalp -Reports bumps on the scalp consistent with folliculitis. - Prescribed Clindagel for scalp folliculitis. -Much improved today. Now using selsum blue shampoo   # Insomnia related to chemotherapy and steroids Insomnia likely related to steroid use during chemotherapy.  - Steroid dose reduced with the second cycle. - Long discussion on options today.  After review, we will start low dose trazodone (50mg ). Considered olanzapine but given potential long term side effects, will hold off for now. Can consider adding in olanzapine prn if symptoms worse around treatment.   #H/o chronic constipation, Chemotherapy-induced constipation Experiences constipation likely due to Zofran . Previous use of Dulcolax resulted in severe gastrointestinal distress. - Initiate Senokot S twice daily. - Consider magnesium citrate for controlled laxative effect. - Increase water intake to at least 64 ounces daily. -Much improved today with cessation of zofran . Continue colace as needed  #Chemotherapy-induced nausea and vomiting Nausea managed with Zofran , contributing to constipation.  Alternative antiemetic options are being explored to reduce constipation while maintaining efficacy.  Insurance approval required for alternative antiemetic Aloxi. Improvement with aloxi and compazine. Will continue for now, however, can consider adding on olanzapine prn if symptoms persist   #Cough -Given hx, possible GERD. Lower concern for respiratory source at this time, however will have low threshold for imaging -Will send famotidine 20mg  BID to trial  #Thrush  -New Rx sent for nystatin today  #Wig  -will reach  out for wig authorization  #Vaginal pain -will inquire if restarting topical estrogen is an option for pt -previously tried coconut oil w/o improvement   Vulvar bumps,  Recent onset of painful bumps on vulvar area and saw gyn.  Diagnosed with barthollian cysts.  Using topical estrogen.  - New bumps of clitoris.  - Differential includes HSV, but unlikely due to lack of history and presentation at this time.  Discussed s/sx to monitor for.   - Husband with h/o HSV1/cold sores. - HSV antibody done today post visit.  ADDENDUM: + antibody HSV 1.  No h/o cold sores..   - Monitor vulvar bumps for resolution. - Will draw HSV PCR next week for concern of active infection.  Consider antiviral prophylaxis as her immunosuppression with treatment now requires growth factor shots. ADDENDUM:  09/05/24 HSV PCR negative.    #Clinical trials: TR[facility]ION breast 04  FOLLOW-UP: Return in one week for C4D15 for labs and OTC only. Return 10/01/24 for labs, clinic [facility], and OTC HR4 AC- [place].  Future Appointments     Date/Time Provider Department Center Visit Type  09/13/2024 8:40 AM (Arrive by 8:25 AM) PORT NURSE [facility] Lab Draw [facility] LAB   09/13/2024 9:45 AM (Arrive by 9:15 AM) OTC [facility] Oncology Treatment [facility] ONCOLOGY [place] 5HR   02/06/2025 7:00 AM [facility]   02/13/2025 8:30 AM Hande, Tamra Cal, MD [facility] C PHYSICAL        I spent 31 minutes with Niels Blumenthal and this includes face to face time and non-face to face time preparing to see the patient (eg, review of tests), obtaining and/or reviewing separately obtained history, independently interpreting results (not separately reported), communicating results to the patient/family/caregiver, and Care coordination (not separately reported). More than 50% of time was spent in face to face care of patient.  All questions were answered to the best of my ability.     Attestation Statement:   I personally performed the service, non-incident to. (WP)        Ricka Jenkins Glazier, NP Breast Medical Oncology [facility]

## 2024-09-13 NOTE — Progress Notes (Signed)
 Patient presented to OTC for port de-access. Patient de-accessed per protocol.  Nursing Assessment Neurological- alert and oriented x4 Cardiopulmonary- no issues Genitourinary- no issues Gastrointestinal- 4/10 nausea, declines interventions at this time Skin- no new rashes, wounds, or areas of concern Psychosocial- no issues Nutritional- no issues Fatigue level- 10/10 - patient reports less than 3 hours of sleep per night for the past 4-5 days due to pain. Oral mucositis- 0/5 Pain score- 0/10 at time of assessment but reports 8-9/10 pain each night in legs starting around 7 PM.  See flowsheet for falls assessment, IV assessment, and vital signs.   Patient was discharged from treatment room.

## 2024-09-14 NOTE — Telephone Encounter (Signed)
 MRN: [id number] DOB: 06-Sep-1966  58 y.o. female Hande, Tamra Cal, MD No Known Allergies   OPENING STATEMENT GIVEN. PT VERIFIED WITH 3 HIPAA VERIFIERS.     Patient is experiencing new/worsening symptoms.  Patient called with concerns that she has been experiencing worsening pain that now includes her back.  She started Gabapentin yesterday but she stated that it did not help.     Care Advice given per protocol, Patient should see Physician Within 4 Hours  Patient stated that she will go to urgent care to be evaluated.    Instructed caller to seek immediate medical attention for any new/worsening symptoms, questions or concerns.  Caller verbalizes understanding and is without questions at this time.  Reason for Disposition  [1] Pain radiates into the thigh or further down the leg AND [2] both legs  Additional Information  Negative: Passed out (i.e., lost consciousness, collapsed and was not responding)  Negative: Shock suspected (e.g., cold/pale/clammy skin, too weak to stand, low BP, rapid pulse)  Negative: Sounds like a life-threatening emergency to the triager  Negative: Major injury to the back (e.g., MVA, fall > 10 feet or 3 meters, penetrating injury, etc.)  Negative: Followed a tailbone injury  Negative: [1] Pain in the upper back over the ribs (rib cage) AND [2] radiates (travels, goes) into chest  Negative: [1] Pain in the upper back over the ribs (rib cage) AND [2] worsened by coughing (or clearly increases with breathing)  Negative: Back pain during pregnancy  Negative: Pain mainly in flank (i.e., in the side, over the lower ribs or just below the ribs)  Negative: [1] SEVERE back pain (e.g., excruciating) AND [2] sudden onset AND [3] age > 35  Negative: [1] Unable to urinate (or only a few drops) > 4 hours AND     [2] bladder feels very full (e.g., palpable bladder or strong urge to urinate)  Negative: [1] Urinary or bowel incontinence (i.e., loss of bladder  or bowel control) AND [2] new onset  Negative: Numbness in groin or rectal area (i.e., loss of sensation)  Negative: [1] SEVERE abdominal pain AND [2] present > 1 hour  Negative: [1] Abdominal pain AND [2] age > 60  Negative: Weakness of a leg or foot (e.g., unable to bear weight, dragging foot)  Negative: Unable to walk  Negative: Patient sounds very sick or weak to the triager  Negative: [1] SEVERE back pain (e.g., excruciating, unable to do any normal activities) AND [2] not improved 2 hours after pain medicine  Answer Assessment - Initial Assessment Questions 1. ONSET: When did the pain begin?  a week ago 2. LOCATION: Where does it hurt? (upper, mid or lower back) hips down to ankles 3. SEVERITY: How bad is the pain?  (e.g., Scale 1-10; mild, moderate, or severe)   - MILD (1-3): doesn't interfere with normal activities    - MODERATE (4-7): interferes with normal activities or awakens from sleep    - SEVERE (8-10): excruciating pain, unable to do any normal activities  7/10 4. PATTERN: Is the pain constant? (e.g., yes, no; constant, intermittent)  intermittent 5. RADIATION: Does the pain shoot into your legs or elsewhere? yes 6. CAUSE:  What do you think is causing the back pain?  Restless Leg Syndrome 7. BACK OVERUSE:  Any recent lifting of heavy objects, strenuous work or exercise? no 8. MEDICATIONS: What have you taken so far for the pain? (e.g., nothing, acetaminophen, NSAIDS) Gabapentin 9. NEUROLOGIC SYMPTOMS: Do you have any weakness, numbness,  or problems with bowel/bladder control? no 10. OTHER SYMPTOMS: Do you have any other symptoms? (e.g., fever, abdominal pain, burning with urination, blood in urine) no  Protocols used: Back Pain-A-AH

## 2024-09-15 ENCOUNTER — Emergency Department
Admission: EM | Admit: 2024-09-15 | Discharge: 2024-09-16 | Disposition: A | Attending: Emergency Medicine | Admitting: Emergency Medicine

## 2024-09-15 ENCOUNTER — Other Ambulatory Visit: Payer: Self-pay

## 2024-09-15 DIAGNOSIS — T451X5A Adverse effect of antineoplastic and immunosuppressive drugs, initial encounter: Secondary | ICD-10-CM | POA: Insufficient documentation

## 2024-09-15 DIAGNOSIS — G2581 Restless legs syndrome: Secondary | ICD-10-CM | POA: Diagnosis present

## 2024-09-15 DIAGNOSIS — Z853 Personal history of malignant neoplasm of breast: Secondary | ICD-10-CM | POA: Insufficient documentation

## 2024-09-15 NOTE — ED Triage Notes (Signed)
 Pt presents for restless legs after receiving chemo for breast cancer 1 week ago. Never had this happen before. Prescribed gabapentin but no relief. Able to ambulate without difficulty

## 2024-09-16 MED ORDER — CLONAZEPAM 0.5 MG PO TABS
1.5000 mg | ORAL_TABLET | Freq: Once | ORAL | Status: AC
  Administered 2024-09-16: 1.5 mg via ORAL
  Filled 2024-09-16: qty 3

## 2024-09-16 MED ORDER — CLONAZEPAM 1 MG PO TABS: 1.0000 mg | ORAL_TABLET | Freq: Every evening | ORAL | 0 refills | Status: AC | PRN

## 2024-09-16 NOTE — ED Provider Notes (Signed)
 [facility] Provider Note    Event Date/Time   First MD Initiated Contact with Patient 09/15/24 2346     (approximate)   History   Leg Pain   HPI  Catherine Yoder is a 58 y.o. female who is undergoing chemotherapy for breast CA at [facility].  Patient reports after having chemotherapy last week she has had very restless legs.  She has trialed gabapentin with little improvement, trazodone does not help for her.  She has no pain but feels that she has to keep moving her legs and she is not getting any sleep.  Chemotherapy was canceled 2 days ago because of low platelets     Physical Exam   Triage Vital Signs: ED Triage Vitals  Encounter Vitals Group     BP 09/15/24 2339 128/73     Girls Systolic BP Percentile --      Girls Diastolic BP Percentile --      Boys Systolic BP Percentile --      Boys Diastolic BP Percentile --      Pulse Rate 09/15/24 2339 (!) 109     Resp 09/15/24 2339 18     Temp 09/15/24 2339 97.9 F (36.6 C)     Temp Source 09/15/24 2339 Oral     SpO2 09/15/24 2339 99 %     Weight 09/15/24 2340 71.7 kg (158 lb)     Height 09/15/24 2340 1.651 m (5' 5)     Head Circumference --      Peak Flow --      Pain Score 09/15/24 2339 8     Pain Loc --      Pain Education --      Exclude from Growth Chart --     Most recent vital signs: Vitals:   09/15/24 2339  BP: 128/73  Pulse: (!) 109  Resp: 18  Temp: 97.9 F (36.6 C)  SpO2: 99%     General: Awake, no distress.  CV:  Good peripheral perfusion.  Resp:  Normal effort.  Abd:  No distention.  Other:     ED Results / Procedures / Treatments   Labs (all labs ordered are listed, but only abnormal results are displayed) Labs Reviewed - No data to display   EKG     RADIOLOGY     PROCEDURES:  Critical Care performed:   Procedures   MEDICATIONS ORDERED IN ED: Medications  clonazePAM  (KLONOPIN ) tablet 1.5 mg (has no administration in time range)      IMPRESSION / MDM / ASSESSMENT AND PLAN / ED COURSE  I reviewed the triage vital signs and the nursing notes. Patient's presentation is most consistent with exacerbation of chronic illness.  Patient overall well-appearing and in no acute distress however she feels the need to continue to move around the room because of restless legs.  This only became significant after her most recent chemotherapy as it was a higher dose  she believes.  This certainly is reasonable.  I recommended Klonopin  to help her sleep and rest as that appears to be what she needs most at this time.  Will give 1.5 mg of Klonopin  here, prescription provided, she is seeing her oncologist in 2 days  She and her husband agree with this plan          FINAL CLINICAL IMPRESSION(S) / ED DIAGNOSES   Final diagnoses:  Chemotherapy adverse reaction, initial encounter  Restless legs     Rx / DC Orders  ED Discharge Orders          Ordered    clonazePAM  (KLONOPIN ) 1 MG tablet  At bedtime PRN        09/16/24 0002             Note:  This document was prepared using Dragon voice recognition software and may include unintentional dictation errors.   Arlander Charleston, MD 09/16/24 [id number]
